# Patient Record
Sex: Female | Born: 1975 | Race: White | Hispanic: No | Marital: Married | Smoking: Current every day smoker
Health system: Southern US, Community
[De-identification: ages and names within clinical notes are randomized; demographics above are authoritative.]

## PROBLEM LIST (undated history)

## (undated) DIAGNOSIS — J45909 Unspecified asthma, uncomplicated: Secondary | ICD-10-CM

## (undated) DIAGNOSIS — D649 Anemia, unspecified: Secondary | ICD-10-CM

## (undated) DIAGNOSIS — M5430 Sciatica, unspecified side: Secondary | ICD-10-CM

## (undated) DIAGNOSIS — T7840XA Allergy, unspecified, initial encounter: Secondary | ICD-10-CM

## (undated) DIAGNOSIS — J449 Chronic obstructive pulmonary disease, unspecified: Secondary | ICD-10-CM

## (undated) DIAGNOSIS — M539 Dorsopathy, unspecified: Secondary | ICD-10-CM

## (undated) DIAGNOSIS — K219 Gastro-esophageal reflux disease without esophagitis: Secondary | ICD-10-CM

## (undated) DIAGNOSIS — G5600 Carpal tunnel syndrome, unspecified upper limb: Secondary | ICD-10-CM

## (undated) DIAGNOSIS — F419 Anxiety disorder, unspecified: Secondary | ICD-10-CM

## (undated) HISTORY — DX: Allergy, unspecified, initial encounter: T78.40XA

## (undated) HISTORY — DX: Gastro-esophageal reflux disease without esophagitis: K21.9

## (undated) HISTORY — DX: Unspecified asthma, uncomplicated: J45.909

## (undated) HISTORY — DX: Anemia, unspecified: D64.9

## (undated) HISTORY — DX: Dorsopathy, unspecified: M53.9

## (undated) HISTORY — DX: Anxiety disorder, unspecified: F41.9

## (undated) HISTORY — DX: Sciatica, unspecified side: M54.30

## (undated) HISTORY — DX: Carpal tunnel syndrome, unspecified upper limb: G56.00

## (undated) HISTORY — PX: OTHER SURGICAL HISTORY: SHX169

---

## 2004-11-29 ENCOUNTER — Ambulatory Visit: Payer: Self-pay | Admitting: Specialist

## 2006-09-09 ENCOUNTER — Emergency Department: Payer: Self-pay | Admitting: General Practice

## 2007-07-01 ENCOUNTER — Emergency Department: Payer: Self-pay | Admitting: Internal Medicine

## 2007-07-08 ENCOUNTER — Emergency Department: Payer: Self-pay | Admitting: Emergency Medicine

## 2008-03-05 ENCOUNTER — Emergency Department: Payer: Self-pay | Admitting: Emergency Medicine

## 2009-02-16 ENCOUNTER — Emergency Department: Payer: Self-pay | Admitting: Emergency Medicine

## 2009-10-27 ENCOUNTER — Emergency Department: Payer: Self-pay | Admitting: Emergency Medicine

## 2010-01-09 ENCOUNTER — Emergency Department: Payer: Self-pay | Admitting: Emergency Medicine

## 2011-06-24 ENCOUNTER — Emergency Department: Payer: Self-pay | Admitting: Emergency Medicine

## 2012-04-19 ENCOUNTER — Emergency Department: Payer: Self-pay | Admitting: Emergency Medicine

## 2017-06-07 ENCOUNTER — Emergency Department: Payer: Self-pay

## 2017-06-07 ENCOUNTER — Other Ambulatory Visit: Payer: Self-pay

## 2017-06-07 ENCOUNTER — Emergency Department
Admission: EM | Admit: 2017-06-07 | Discharge: 2017-06-07 | Disposition: A | Discharge status: Home / Self Care | Payer: Self-pay | Attending: Emergency Medicine | Admitting: Emergency Medicine

## 2017-06-07 DIAGNOSIS — R1011 Right upper quadrant pain: Secondary | ICD-10-CM | POA: Insufficient documentation

## 2017-06-07 DIAGNOSIS — F17210 Nicotine dependence, cigarettes, uncomplicated: Secondary | ICD-10-CM | POA: Insufficient documentation

## 2017-06-07 DIAGNOSIS — N390 Urinary tract infection, site not specified: Secondary | ICD-10-CM | POA: Insufficient documentation

## 2017-06-07 DIAGNOSIS — J449 Chronic obstructive pulmonary disease, unspecified: Secondary | ICD-10-CM | POA: Insufficient documentation

## 2017-06-07 HISTORY — DX: Chronic obstructive pulmonary disease, unspecified: J44.9

## 2017-06-07 LAB — URINALYSIS, COMPLETE (UACMP) WITH MICROSCOPIC
Bacteria, UA: NONE SEEN
SPECIFIC GRAVITY, URINE: 1.028 (ref 1.005–1.030)

## 2017-06-07 LAB — BASIC METABOLIC PANEL
Anion gap: 10 (ref 5–15)
BUN: 14 mg/dL (ref 6–20)
CALCIUM: 8.8 mg/dL — AB (ref 8.9–10.3)
CO2: 24 mmol/L (ref 22–32)
CREATININE: 0.81 mg/dL (ref 0.44–1.00)
Chloride: 105 mmol/L (ref 101–111)
GFR calc non Af Amer: >60 mL/min (ref >60)
Glucose, Bld: 92 mg/dL (ref 65–99)
Potassium: 3.9 mmol/L (ref 3.5–5.1)
SODIUM: 139 mmol/L (ref 135–145)

## 2017-06-07 LAB — CBC
HCT: 40.9 % (ref 35.0–47.0)
Hemoglobin: 13.6 g/dL (ref 12.0–16.0)
MCH: 31.1 pg (ref 26.0–34.0)
MCHC: 33.3 g/dL (ref 32.0–36.0)
MCV: 93.3 fL (ref 80.0–100.0)
PLATELETS: 312 10*3/uL (ref 150–440)
RBC: 4.38 MIL/uL (ref 3.80–5.20)
RDW: 14.5 % (ref 11.5–14.5)
WBC: 11.4 10*3/uL — ABNORMAL HIGH (ref 3.6–11.0)

## 2017-06-07 LAB — HEPATIC FUNCTION PANEL
ALBUMIN: 3.3 g/dL — AB (ref 3.5–5.0)
ALK PHOS: 63 U/L (ref 38–126)
ALT: 42 U/L (ref 14–54)
AST: 62 U/L — AB (ref 15–41)
Bilirubin, Direct: <0.1 mg/dL — ABNORMAL LOW (ref 0.1–0.5)
Total Bilirubin: 0.6 mg/dL (ref 0.3–1.2)
Total Protein: 6.5 g/dL (ref 6.5–8.1)

## 2017-06-07 LAB — TROPONIN I

## 2017-06-07 LAB — LIPASE, BLOOD: Lipase: 31 U/L (ref 11–51)

## 2017-06-07 MED ORDER — OMEPRAZOLE 40 MG PO CPDR: 40.0000 mg | DELAYED_RELEASE_CAPSULE | Freq: Every day | ORAL | 1 refills | Status: DC

## 2017-06-07 MED ORDER — CEPHALEXIN 500 MG PO CAPS: 500.0000 mg | ORAL_CAPSULE | Freq: Two times a day (BID) | ORAL | 0 refills | Status: AC

## 2017-06-07 MED ORDER — OXYCODONE-ACETAMINOPHEN 5-325 MG PO TABS
1.0000 | ORAL_TABLET | Freq: Once | ORAL | Status: AC
  Administered 2017-06-07: 1 via ORAL
  Filled 2017-06-07: qty 1

## 2017-06-07 MED ORDER — ALBUTEROL SULFATE HFA 108 (90 BASE) MCG/ACT IN AERS: 2.0000 | INHALATION_SPRAY | Freq: Four times a day (QID) | RESPIRATORY_TRACT | 2 refills | Status: DC | PRN

## 2017-06-07 MED ORDER — CEPHALEXIN 500 MG PO CAPS
500.0000 mg | ORAL_CAPSULE | Freq: Once | ORAL | Status: AC
  Administered 2017-06-07: 500 mg via ORAL
  Filled 2017-06-07: qty 1

## 2017-06-07 NOTE — ED Triage Notes (Signed)
Pt came to ED via pov c/o upper abdominal pain starting yesterday w/ sob. Pt also c/o burning with urination and right ear pain.

## 2017-06-07 NOTE — ED Notes (Signed)
Patient transported to US 

## 2017-06-07 NOTE — ED Notes (Signed)
Pt ambulatory upon discharge. Pt and significant other verbalized understanding of discharge instructions, prescriptions and follow-up care. VSS. Skin warm and dry. A&O x4. 

## 2017-06-07 NOTE — ED Provider Notes (Signed)
[facility] Emergency Department Provider Note ____________________________________________   First MD Initiated Contact with Patient 06/07/17 1436     (approximate)  I have reviewed the triage vital signs and the nursing notes.   HISTORY  Chief Complaint Abdominal Pain    HPI Amber Edwards is a 42 y.o. female with history of COPD who presents with right upper quadrant abdominal pain over the last 2-3 days, constant but intermittent in intensity, and associated with mildly increased shortness of breath.  Patient reports some nausea, but no vomiting or diarrhea.  She denies any relation to food.  She reports no prior history of this pain for the last several days, but she states her shortness of breath is chronic.  The patient also reports dysuria and urinary frequency over the last 3 weeks which she attempted to self medicate with cranberry juice.  She reports mild bilateral low back pain but no flank pain, and no fever or chills.  Past Medical History:  Diagnosis Date  . COPD (chronic obstructive pulmonary disease) (HCC)     There are no active problems to display for this patient.   Past Surgical History:  Procedure Laterality Date  . left knee surgery      Prior to Admission medications   Not on File    Allergies Patient has no known allergies.  No family history on file.  Social History Social History   Tobacco Use  . Smoking status: Current Every Day Smoker    Packs/day: 1.00    Types: Cigarettes  . Smokeless tobacco: Never Used  Substance Use Topics  . Alcohol use: No    Frequency: Never  . Drug use: No    Review of Systems  Constitutional: No fever/chills.  Eyes: No redness. ENT: No sore throat. Cardiovascular: Denies chest pain. Respiratory: Positive for shortness of breath. Gastrointestinal: Positive for nausea, no vomiting. Genitourinary: Positive for dysuria.  Musculoskeletal: Positive for back pain. Skin:  Negative for rash. Neurological: Negative for headache.   ____________________________________________   PHYSICAL EXAM:  VITAL SIGNS: ED Triage Vitals  Enc Vitals Group     BP 06/07/17 1126 137/69     Pulse Rate 06/07/17 1126 81     Resp 06/07/17 1126 18     Temp 06/07/17 1126 97.7 F (36.5 C)     Temp Source 06/07/17 1126 Oral     SpO2 06/07/17 1126 97 %     Weight 06/07/17 1127 260 lb (117.9 kg)     Height 06/07/17 1127 _0  (1.626 m)     Head Circumference --      Peak Flow --      Pain Score 06/07/17 1415 8     Pain Loc --      Pain Edu? --      Excl. in [place]? --     Constitutional: Alert and oriented. Well appearing and in no acute distress. Eyes: Conjunctivae are normal.  No scleral icterus. Head: Atraumatic. Nose: No congestion/rhinnorhea. Mouth/Throat: Mucous membranes are moist.   Neck: Normal range of motion.  Cardiovascular: Normal rate, regular rhythm. Grossly normal heart sounds.  Good peripheral circulation. Respiratory: Normal respiratory effort.  No retractions.  Slightly decreased breath sounds bilaterally but lungs otherwise CTAB. Gastrointestinal: Soft with moderate right upper quadrant tenderness. No distention.  Genitourinary: No CVA tenderness. Musculoskeletal: No lower extremity edema.  Extremities warm and well perfused.  Neurologic:  Normal speech and language. No gross focal neurologic deficits are appreciated.  Skin:  Skin is warm and dry. No rash noted. Psychiatric: Mood and affect are normal. Speech and behavior are normal.  ____________________________________________   LABS (all labs ordered are listed, but only abnormal results are displayed)  Labs Reviewed  BASIC METABOLIC PANEL - Abnormal; Notable for the following components:      Result Value   Calcium 8.8 (*)    All other components within normal limits  CBC - Abnormal; Notable for the following components:   WBC 11.4 (*)    All other components within normal limits    URINALYSIS, COMPLETE (UACMP) WITH MICROSCOPIC - Abnormal; Notable for the following components:   Color, Urine ORANGE (*)    APPearance CLOUDY (*)    Glucose, UA   (*)    Value: TEST NOT REPORTED DUE TO COLOR INTERFERENCE OF URINE PIGMENT   Hgb urine dipstick   (*)    Value: TEST NOT REPORTED DUE TO COLOR INTERFERENCE OF URINE PIGMENT   Bilirubin Urine   (*)    Value: TEST NOT REPORTED DUE TO COLOR INTERFERENCE OF URINE PIGMENT   Ketones, ur   (*)    Value: TEST NOT REPORTED DUE TO COLOR INTERFERENCE OF URINE PIGMENT   Protein, ur   (*)    Value: TEST NOT REPORTED DUE TO COLOR INTERFERENCE OF URINE PIGMENT   Nitrite   (*)    Value: TEST NOT REPORTED DUE TO COLOR INTERFERENCE OF URINE PIGMENT   Leukocytes, UA   (*)    Value: TEST NOT REPORTED DUE TO COLOR INTERFERENCE OF URINE PIGMENT   Squamous Epithelial / LPF 0-5 (*)    All other components within normal limits  HEPATIC FUNCTION PANEL - Abnormal; Notable for the following components:   Albumin 3.3 (*)    AST 62 (*)    Bilirubin, Direct <0.1 (*)    All other components within normal limits  TROPONIN I  LIPASE, BLOOD  POC URINE PREG, ED   ____________________________________________  EKG  ED ECG REPORT I, Arta Silence, the attending physician, personally viewed and interpreted this ECG.  Date: 06/07/2017 EKG Time: 1131 Rate: 81 Rhythm: normal sinus rhythm QRS Axis: normal Intervals: normal ST/T Wave abnormalities: normal Narrative Interpretation: no evidence of acute ischemia  ____________________________________________  RADIOLOGY  CXR: No focal infiltrate or other acute findings [place] RUQ: CBD borderline dilated, otherwise no acute findings  ____________________________________________   PROCEDURES  Procedure(s) performed: No    Critical Care performed: No ____________________________________________   INITIAL IMPRESSION / ASSESSMENT AND PLAN / ED COURSE  Pertinent labs & imaging results that  were available during my care of the patient were reviewed by me and considered in my medical decision making (see chart for details).  42 year old female with history of COPD and other PMH as noted above presents with right upper quadrant pain over the last 2-3 days associated with some nausea, in which she states somewhat exacerbates her chronic shortness of breath.  She also reports dysuria and frequency over the last 3 weeks.  Past medical records reviewed in epic and are noncontributory.  On exam, the patient does have some tenderness the right upper quadrant but the remainder the abdomen is soft.  Her lungs are clear.  There are no other significant exam findings.  Differential for the right upper quadrant pain includes primarily biliary colic, less likely acute cholecystitis, or gastritis, pancreatitis.  Plan for right upper quadrant ultrasound, chest x-ray, and basic and hepatobiliary labs.  Urinary symptoms are consistent with an acute UTI,  and the patient's UA corresponds to this.    ----------------------------------------- 5:13 PM on 06/07/2017 -----------------------------------------  Ultrasound does not reveal gallstones.  CBD is borderline large, but since there is no elevation in bilirubins or alk phos and LFTs are normal, no evidence for biliary obstruction.  Suspect therefore likely gastritis is most likely etiology.  In addition, patient has UTI as noted above.  We will discharge with antibiotics as well as a PPI and give the patient an outpatient primary care referral.  Discharge instructions and return precautions explained to the patient, and she expresses understanding.  ____________________________________________   FINAL CLINICAL IMPRESSION(S) / ED DIAGNOSES  Final diagnoses:  Right upper quadrant abdominal pain  Urinary tract infection without hematuria, site unspecified      NEW MEDICATIONS STARTED DURING THIS VISIT:  New Prescriptions   No medications  on file     Note:  This document was prepared using Dragon voice recognition software and may include unintentional dictation errors.    Arta Silence, MD 06/07/17 1715

## 2017-06-07 NOTE — Discharge Instructions (Signed)
Take the antibiotic as prescribed and finish the full course.  You should also start on the acid reducer to help improve your upper abdominal pain.  Return to the emergency department for new, worsening, or persistent abdominal pain, vomiting, fevers, weakness, or any other new or worsening symptoms that concern you.

## 2017-06-27 ENCOUNTER — Other Ambulatory Visit: Payer: Self-pay

## 2017-06-27 ENCOUNTER — Emergency Department
Admission: EM | Admit: 2017-06-27 | Discharge: 2017-06-27 | Disposition: A | Discharge status: Home / Self Care | Payer: Self-pay | Attending: Emergency Medicine | Admitting: Emergency Medicine

## 2017-06-27 ENCOUNTER — Emergency Department: Payer: Self-pay

## 2017-06-27 ENCOUNTER — Encounter: Payer: Self-pay | Admitting: Emergency Medicine

## 2017-06-27 DIAGNOSIS — J40 Bronchitis, not specified as acute or chronic: Secondary | ICD-10-CM | POA: Insufficient documentation

## 2017-06-27 DIAGNOSIS — R509 Fever, unspecified: Secondary | ICD-10-CM | POA: Insufficient documentation

## 2017-06-27 DIAGNOSIS — J449 Chronic obstructive pulmonary disease, unspecified: Secondary | ICD-10-CM | POA: Insufficient documentation

## 2017-06-27 DIAGNOSIS — F17210 Nicotine dependence, cigarettes, uncomplicated: Secondary | ICD-10-CM | POA: Insufficient documentation

## 2017-06-27 DIAGNOSIS — R05 Cough: Secondary | ICD-10-CM | POA: Insufficient documentation

## 2017-06-27 DIAGNOSIS — J189 Pneumonia, unspecified organism: Secondary | ICD-10-CM | POA: Insufficient documentation

## 2017-06-27 DIAGNOSIS — Z79899 Other long term (current) drug therapy: Secondary | ICD-10-CM | POA: Insufficient documentation

## 2017-06-27 DIAGNOSIS — R0981 Nasal congestion: Secondary | ICD-10-CM | POA: Insufficient documentation

## 2017-06-27 DIAGNOSIS — R0602 Shortness of breath: Secondary | ICD-10-CM | POA: Insufficient documentation

## 2017-06-27 LAB — BASIC METABOLIC PANEL
ANION GAP: 11 (ref 5–15)
BUN: 9 mg/dL (ref 6–20)
CO2: 23 mmol/L (ref 22–32)
Calcium: 9 mg/dL (ref 8.9–10.3)
Chloride: 103 mmol/L (ref 101–111)
Creatinine, Ser: 0.72 mg/dL (ref 0.44–1.00)
GFR calc Af Amer: >60 mL/min (ref >60)
Glucose, Bld: 87 mg/dL (ref 65–99)
POTASSIUM: 3.8 mmol/L (ref 3.5–5.1)
SODIUM: 137 mmol/L (ref 135–145)

## 2017-06-27 LAB — CBC
HEMATOCRIT: 44.7 % (ref 35.0–47.0)
HEMOGLOBIN: 15.3 g/dL (ref 12.0–16.0)
MCH: 31.4 pg (ref 26.0–34.0)
MCHC: 34.2 g/dL (ref 32.0–36.0)
MCV: 91.8 fL (ref 80.0–100.0)
Platelets: 262 10*3/uL (ref 150–440)
RBC: 4.87 MIL/uL (ref 3.80–5.20)
RDW: 14.5 % (ref 11.5–14.5)
WBC: 5.2 10*3/uL (ref 3.6–11.0)

## 2017-06-27 LAB — TROPONIN I

## 2017-06-27 MED ORDER — GUAIFENESIN-CODEINE 100-10 MG/5ML PO SOLN: 5.0000 mL | Freq: Four times a day (QID) | ORAL | 0 refills | Status: DC | PRN

## 2017-06-27 MED ORDER — GUAIFENESIN 100 MG/5ML PO SOLN
400.0000 mg | ORAL | Status: DC | PRN
  Administered 2017-06-27: 400 mg via ORAL
  Filled 2017-06-27: qty 20

## 2017-06-27 MED ORDER — HYDROCOD POLST-CPM POLST ER 10-8 MG/5ML PO SUER
5.0000 mL | Freq: Once | ORAL | Status: AC
  Administered 2017-06-27: 5 mL via ORAL
  Filled 2017-06-27: qty 5

## 2017-06-27 MED ORDER — ALBUTEROL SULFATE (2.5 MG/3ML) 0.083% IN NEBU
5.0000 mg | INHALATION_SOLUTION | Freq: Once | RESPIRATORY_TRACT | Status: AC
  Administered 2017-06-27: 5 mg via RESPIRATORY_TRACT
  Filled 2017-06-27: qty 6

## 2017-06-27 MED ORDER — AZITHROMYCIN 500 MG PO TABS
500.0000 mg | ORAL_TABLET | Freq: Once | ORAL | Status: AC
  Administered 2017-06-27: 500 mg via ORAL
  Filled 2017-06-27: qty 1

## 2017-06-27 MED ORDER — AZITHROMYCIN 250 MG PO TABS: 250.0000 mg | ORAL_TABLET | Freq: Every day | ORAL | 0 refills | Status: DC

## 2017-06-27 NOTE — ED Provider Notes (Signed)
[facility] Emergency Department Provider Note  Time seen: 10:21 PM  I have reviewed the triage vital signs and the nursing notes.   HISTORY  Chief Complaint Shortness of Breath    HPI Amber Edwards is a 42 y.o. female with a past medical history of COPD who presents to the emergency department for cough congestion low-grade fever.  According to the patient over the past 1 week she has had a low-grade fever cough congestion difficulty catching her breath.  Patient has been using her albuterol inhaler with minimal relief.  Does not have a primary care doctor to follow-up with so she came to the emergency department for evaluation.  Patient states some pain especially in her back worse with coughing.  Denies sputum production.  Denies any pleuritic nature to the pain.  Denies any leg pain or swelling.  Positive for nasal congestion.  Past Medical History:  Diagnosis Date  . COPD (chronic obstructive pulmonary disease) (HCC)     There are no active problems to display for this patient.   Past Surgical History:  Procedure Laterality Date  . left knee surgery      Prior to Admission medications   Medication Sig Start Date End Date Taking? Authorizing Provider  albuterol (PROVENTIL HFA;VENTOLIN HFA) 108 (90 Base) MCG/ACT inhaler Inhale 2 puffs into the lungs every 6 (six) hours as needed for wheezing or shortness of breath. 06/07/17   Dionne BucySiadecki, Sebastian, MD  omeprazole (PRILOSEC) 40 MG capsule Take 1 capsule (40 mg total) by mouth daily. 06/07/17 08/06/17  Dionne BucySiadecki, Sebastian, MD    No Known Allergies  No family history on file.  Social History Social History   Tobacco Use  . Smoking status: Current Every Day Smoker    Packs/day: 1.00    Types: Cigarettes  . Smokeless tobacco: Never Used  Substance Use Topics  . Alcohol use: No    Frequency: Never  . Drug use: No    Review of Systems Constitutional: Low-grade fever Eyes: Negative for visual  complaints ENT: Positive for congestion Cardiovascular: Negative for chest pain. Respiratory: Positive for shortness of breath.  Positive for cough. Gastrointestinal: Negative for abdominal pain, vomiting Genitourinary: Negative for urinary compaints Musculoskeletal: Negative for leg pain or swelling Skin: Negative for skin complaints  Neurological: Negative for headache All other ROS negative  ____________________________________________   PHYSICAL EXAM:  VITAL SIGNS: ED Triage Vitals  Enc Vitals Group     BP 06/27/17 2028 (!) 136/92     Pulse Rate 06/27/17 2028 (!) 105     Resp 06/27/17 2028 (!) 24     Temp 06/27/17 2028 99.6 F (37.6 C)     Temp Source 06/27/17 2028 Oral     SpO2 06/27/17 2028 97 %     Weight 06/27/17 2029 260 lb (117.9 kg)     Height 06/27/17 2029 5\' 4"  (1.626 m)     Head Circumference --      Peak Flow --      Pain Score 06/27/17 2028 9     Pain Loc --      Pain Edu? --      Excl. in [place]? --     Constitutional: Alert and oriented. Well appearing and in no distress. Eyes: Normal exam ENT   Head: Normocephalic and atraumatic.   Nose: Moderate congestion and rhinorrhea   Mouth/Throat: Mucous membranes are moist. Cardiovascular: Normal rate, regular rhythm around 100 bpm.  No obvious murmur. Respiratory: Very mild tachypnea, slight  expiratory wheeze bilaterally.  No rales or rhonchi. Gastrointestinal: Soft and nontender. No distention Musculoskeletal: Nontender with normal range of motion in all extremities.  Neurologic:  Normal speech and language. No gross focal neurologic deficits  Psychiatric: Mood and affect are normal.   ____________________________________________    EKG  EKG reviewed and interpreted by myself shows sinus tachycardia 101 bpm with a narrow QRS, normal axis, normal intervals, nonspecific ST changes.  No ST elevation.  ____________________________________________    RADIOLOGY  Chest x-ray shows likely  bronchitis with possible pneumonitis.  ____________________________________________   INITIAL IMPRESSION / ASSESSMENT AND PLAN / ED COURSE  Pertinent labs & imaging results that were available during my care of the patient were reviewed by me and considered in my medical decision making (see chart for details).  Patient presents emergency department for 1 week of cough, congestion, low-grade fever.  Overall patient appears well, no distress, slight expiratory wheeze on exam, history of COPD.  Vitals show a low-grade temperature 99.6.  Initially with a 93% room air saturation received breathing treatments in the emergency department currently with a 98% room air saturation throughout my examination.  Heart rate around 90-100 bpm.  Patient's x-ray most consistent with pneumonitis and bronchitis.  We will begin antibiotics.  Patient's labs are largely within normal limits including normal white blood cell count.  Negative troponin.  I discussed continued care at home with albuterol inhaler, finishing her course of antibiotics and I will prescribe a cough medication for the patient.  I also discussed the importance of following up with her primary care doctor in 3-4 weeks to ensure resolution.  Patient agreeable with this plan of care.  I discussed chest pain return precautions.  ____________________________________________   FINAL CLINICAL IMPRESSION(S) / ED DIAGNOSES  Upper respiratory infection Pneumonitis Bronchitis    Minna Antis, MD 06/27/17 2227

## 2017-06-27 NOTE — Discharge Instructions (Signed)
Please follow-up with a primary care doctor for recheck/reevaluation.  Return to the emergency department for development of chest pain, worsening trouble breathing, or any other symptom personally concerning to yourself.

## 2017-06-27 NOTE — ED Notes (Signed)
Patient is resting comfortably. 

## 2017-06-27 NOTE — ED Notes (Signed)
Pt to the ER for sinus congestion and dry hacking cough. Pt taking nyquil, dayquil , vitamin C and tylenol and muccinex at home. No relief with anything. Pt reports chills. Pt is breathing heavily. Pt is not on breathing treatments at home all the time. Pt did one at home but it was her last one. Pt has a hx of copd but no PCP. Pt says she is supposed to be on breathing tx but she stopped due to finances and [facility] didn't have an opening till March.

## 2017-06-27 NOTE — ED Triage Notes (Signed)
Pt reports cough, shortness of breath history of COPD reports back pain sharp pain. Reports used inhaler prior to arrival no relief.

## 2020-03-13 ENCOUNTER — Ambulatory Visit (INDEPENDENT_AMBULATORY_CARE_PROVIDER_SITE_OTHER): Payer: Self-pay

## 2020-03-13 ENCOUNTER — Ambulatory Visit
Admission: EM | Admit: 2020-03-13 | Discharge: 2020-03-13 | Disposition: A | Discharge status: Home / Self Care | Payer: Self-pay | Attending: Family Medicine | Admitting: Family Medicine

## 2020-03-13 ENCOUNTER — Other Ambulatory Visit: Payer: Self-pay

## 2020-03-13 DIAGNOSIS — M25511 Pain in right shoulder: Secondary | ICD-10-CM

## 2020-03-13 DIAGNOSIS — M25411 Effusion, right shoulder: Secondary | ICD-10-CM

## 2020-03-13 MED ORDER — HYDROCODONE-ACETAMINOPHEN 5-325 MG PO TABS: 2.0000 | ORAL_TABLET | ORAL | 0 refills | Status: DC | PRN

## 2020-03-13 MED ORDER — CYCLOBENZAPRINE HCL 10 MG PO TABS: 10.0000 mg | ORAL_TABLET | Freq: Two times a day (BID) | ORAL | 0 refills | Status: DC | PRN

## 2020-03-13 NOTE — ED Provider Notes (Addendum)
[facility]   [id number] 03/13/20 Arrival Time: 1149  ZD:GUYQI PAIN  SUBJECTIVE: History from: patient. Amber Edwards is a 44 y.o. female complains of right shoulder pain that began last night after a fall and catching herself with her right arm. Denies a precipitating event or specific injury. Localizes the pain to the anterior aspect of the shoulder. Describes the pain as constant and sharp in character. Has tried OTC medications without relief. Reports severely limited mobility with the R shoulder. Symptoms are made worse with activity.  Denies similar symptoms in the past. Denies fever, chills, erythema, ecchymosis, effusion, weakness, numbness and tingling, saddle paresthesias, loss of bowel or bladder function.      ROS: As per HPI.  All other pertinent ROS negative.     Past Medical History:  Diagnosis Date  . COPD (chronic obstructive pulmonary disease) (HCC)    Past Surgical History:  Procedure Laterality Date  . left knee surgery     No Known Allergies No current facility-administered medications on file prior to encounter.   Current Outpatient Medications on File Prior to Encounter  Medication Sig Dispense Refill  . albuterol (PROVENTIL HFA;VENTOLIN HFA) 108 (90 Base) MCG/ACT inhaler Inhale 2 puffs into the lungs every 6 (six) hours as needed for wheezing or shortness of breath. 1 Inhaler 2  . azithromycin (ZITHROMAX) 250 MG tablet Take 1 tablet (250 mg total) by mouth daily. 4 each 0  . guaiFENesin-codeine 100-10 MG/5ML syrup Take 5 mLs by mouth every 6 (six) hours as needed for cough. 120 mL 0  . omeprazole (PRILOSEC) 40 MG capsule Take 1 capsule (40 mg total) by mouth daily. 30 capsule 1   Social History   Socioeconomic History  . Marital status: Married    Spouse name: Not on file  . Number of children: Not on file  . Years of education: Not on file  . Highest education level: Not on file  Occupational History  . Not on file  Tobacco Use  .  Smoking status: Current Every Day Smoker    Packs/day: 1.00    Types: Cigarettes  . Smokeless tobacco: Never Used  Substance and Sexual Activity  . Alcohol use: No  . Drug use: No  . Sexual activity: Not on file  Other Topics Concern  . Not on file  Social History Narrative  . Not on file   Social Determinants of Health   Financial Resource Strain:   . Difficulty of Paying Living Expenses: Not on file  Food Insecurity:   . Worried About Programme researcher, broadcasting/film/video in the Last Year: Not on file  . Ran Out of Food in the Last Year: Not on file  Transportation Needs:   . Lack of Transportation (Medical): Not on file  . Lack of Transportation (Non-Medical): Not on file  Physical Activity:   . Days of Exercise per Week: Not on file  . Minutes of Exercise per Session: Not on file  Stress:   . Feeling of Stress : Not on file  Social Connections:   . Frequency of Communication with Friends and Family: Not on file  . Frequency of Social Gatherings with Friends and Family: Not on file  . Attends Religious Services: Not on file  . Active Member of Clubs or Organizations: Not on file  . Attends Banker Meetings: Not on file  . Marital Status: Not on file  Intimate Partner Violence:   . Fear of Current or Ex-Partner: Not on file  .  Emotionally Abused: Not on file  . Physically Abused: Not on file  . Sexually Abused: Not on file   No family history on file.  OBJECTIVE:  Vitals:   03/13/20 1159  BP: 135/89  Pulse: 89  Resp: 18  Temp: 98.2 F (36.8 C)  TempSrc: Oral  SpO2: 96%    General appearance: ALERT; in no acute distress.  Head: NCAT Lungs: Normal respiratory effort [id number] 2+ bilaterally. Cap refill < 2 seconds Musculoskeletal:  Inspection: Skin warm, dry, clear and intact. Swelling noted to anterior R shoulder Palpation: Anterior R shoulder very tender to palpation ROM: limited ROM active and passive to R shoulder Neurologic: Ambulates without difficulty;  Sensation intact about the upper/ lower extremities Psychological: alert and cooperative; normal mood and affect  DIAGNOSTIC STUDIES:  DG Shoulder Right  Result Date: 03/13/2020 CLINICAL DATA:  Right shoulder pain for 1 day post fall and landing on shoulder, constant pain with limited range of motion, unable to lift arm and extend arm out from side EXAM: RIGHT SHOULDER - 2+ VIEW COMPARISON:  None FINDINGS: Osseous mineralization normal. AC joint alignment diameter upper normal. Visualized RIGHT ribs intact. No fracture, dislocation, or bone destruction. IMPRESSION: No acute osseous abnormalities. Electronically Signed   By: Ulyses Southward M.D.   On: 03/13/2020 13:01     ASSESSMENT & PLAN:  1. Pain and swelling of right shoulder     Meds ordered this encounter  Medications  . cyclobenzaprine (FLEXERIL) 10 MG tablet    Sig: Take 1 tablet (10 mg total) by mouth 2 (two) times daily as needed for muscle spasms.    Dispense:  20 tablet    Refill:  0    Order Specific Question:   Supervising Provider    Answer:   Merrilee Jansky [id number]  . HYDROcodone-acetaminophen (NORCO/VICODIN) 5-325 MG tablet    Sig: Take 2 tablets by mouth every 4 (four) hours as needed.    Dispense:  10 tablet    Refill:  0    Order Specific Question:   Supervising Provider    Answer:   Merrilee Jansky [[id number]]   Xray negative today Highly suspicious for soft tissue injury May need surgical repair Continue conservative management of rest, ice, and gentle stretches Take ibuprofen as needed for pain relief (may cause abdominal discomfort, ulcers, and GI bleeds avoid taking with other NSAIDs) Take Norco as prescribed Take cyclobenzaprine at nighttime for symptomatic relief. Avoid driving or operating heavy machinery while using medication. Follow up with ortho if symptoms persist Return or go to the ER if you have any new or worsening symptoms (fever, chills, chest pain, abdominal pain, changes in bowel or  bladder habits, pain radiating into lower legs)   [place] Controlled Substances Registry consulted for this patient. I feel the risk/benefit ratio today is favorable for proceeding with this prescription for a controlled substance. Medication sedation precautions given.  Reviewed expectations re: course of current medical issues. Questions answered. Outlined signs and symptoms indicating need for more acute intervention. Patient verbalized understanding. After Visit Summary given.       Moshe Cipro, NP 03/13/20 1309    Moshe Cipro, NP 03/13/20 1310

## 2020-03-13 NOTE — Discharge Instructions (Signed)
Xray is negative today. I suspect a rotator cuff injury.  We have placed your arm in a sling. Wear this for comfort.  I have sent in Norco for you to take as needed every 6 hours for pain  I have sent in flexeril for you to take as needed for muscle spasms  Follow up with orthopedics

## 2020-03-13 NOTE — ED Triage Notes (Signed)
Patient reports falling last night and landing on right shoulder, reports limited ROM at shoulder.

## 2022-02-07 IMAGING — DX DG SHOULDER 2+V*R*
3 series · 3 of 3 positions shown · non-contrast
Comparison: None

CLINICAL DATA: Right shoulder pain for 1 day post fall and landing
on shoulder, constant pain with limited range of motion, unable to
lift arm and extend arm out from side

EXAM:
RIGHT SHOULDER - 2+ VIEW

[shoulder internal rotation ap]
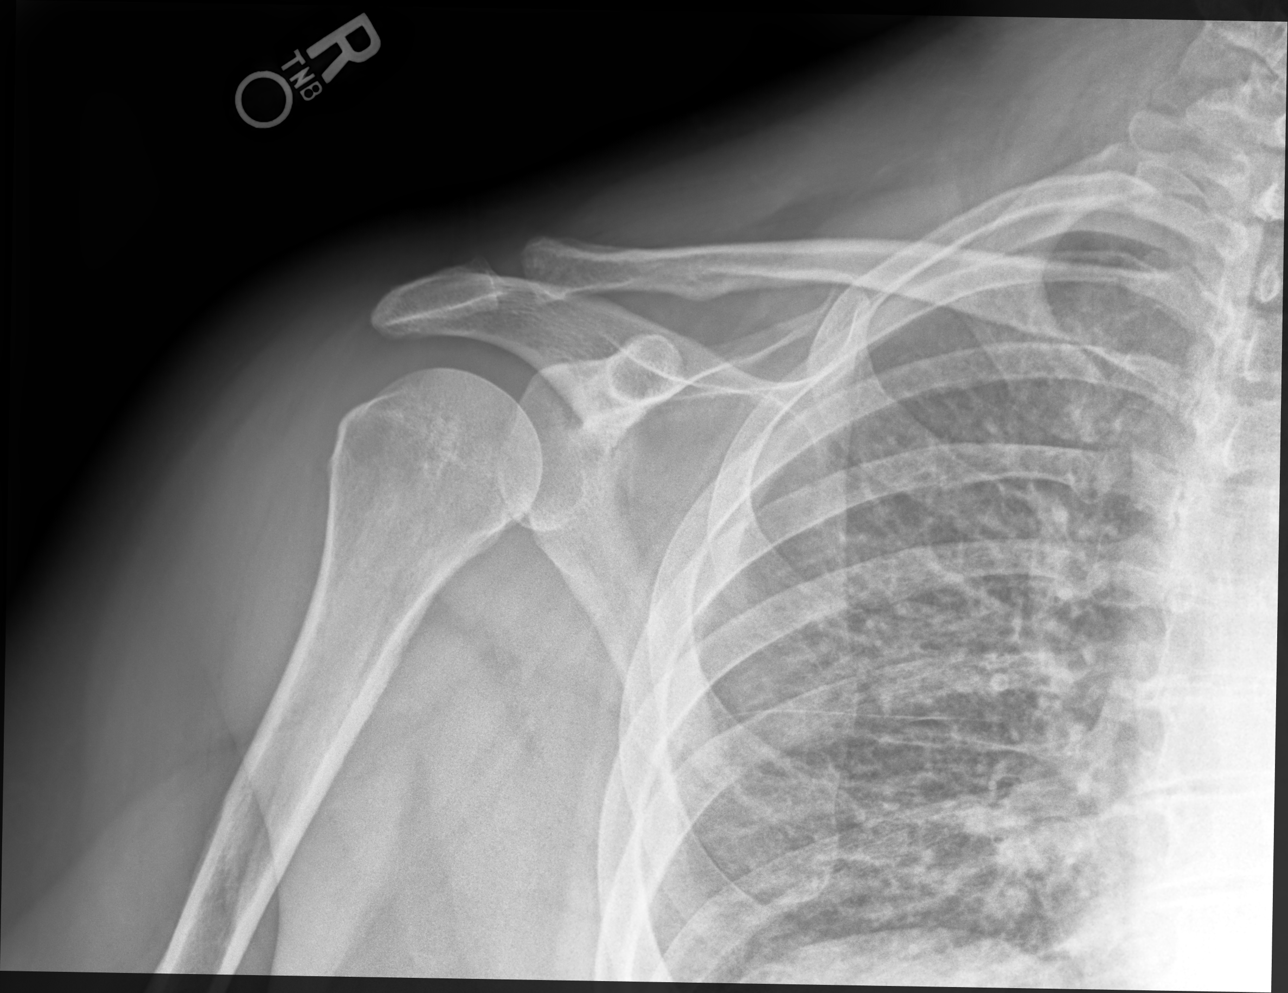

[shoulder (grashey view) ap]
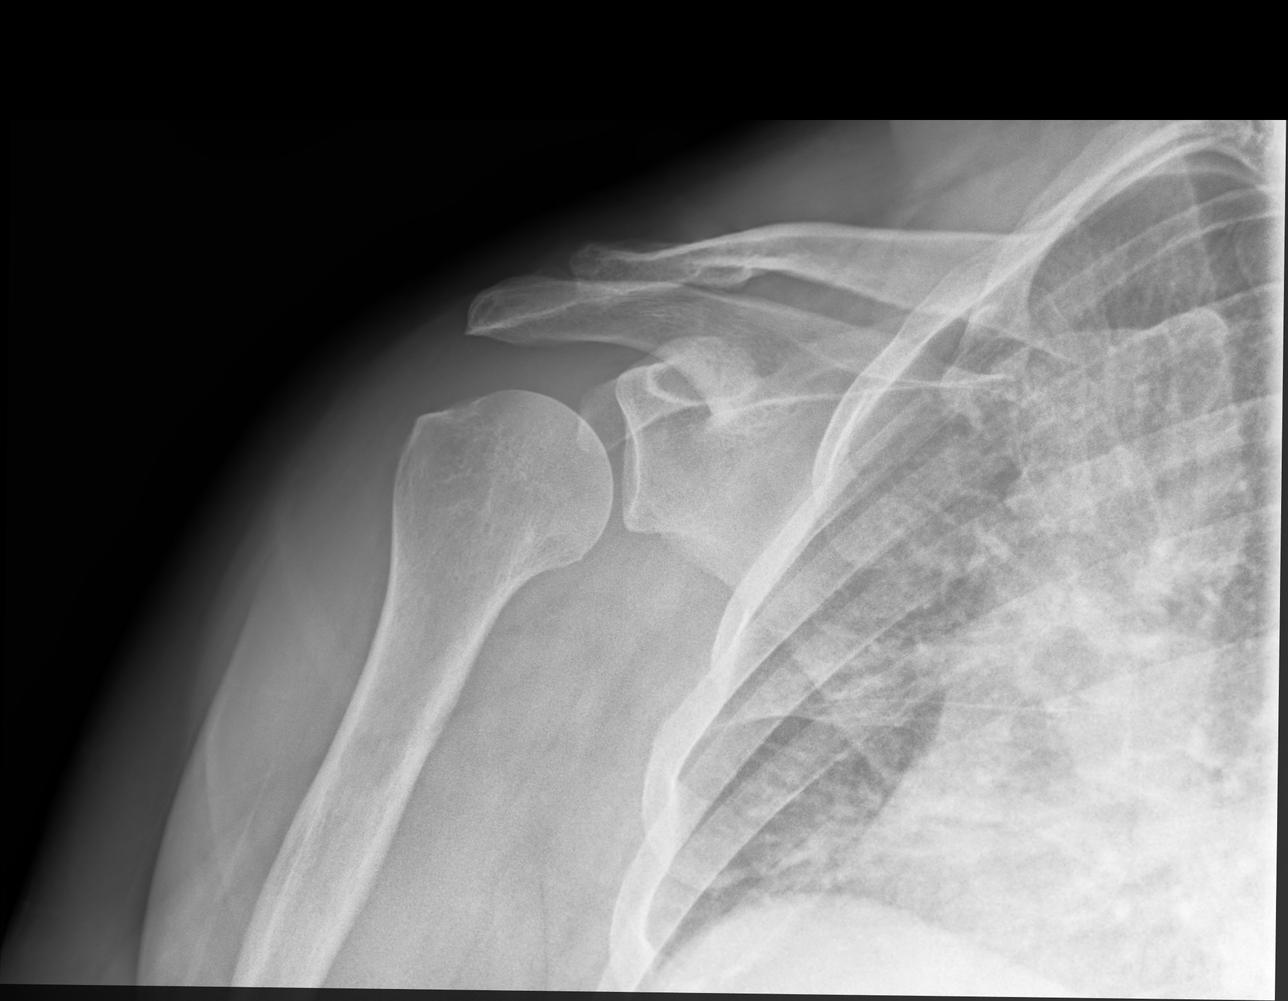

[shoulder (y view) lat]
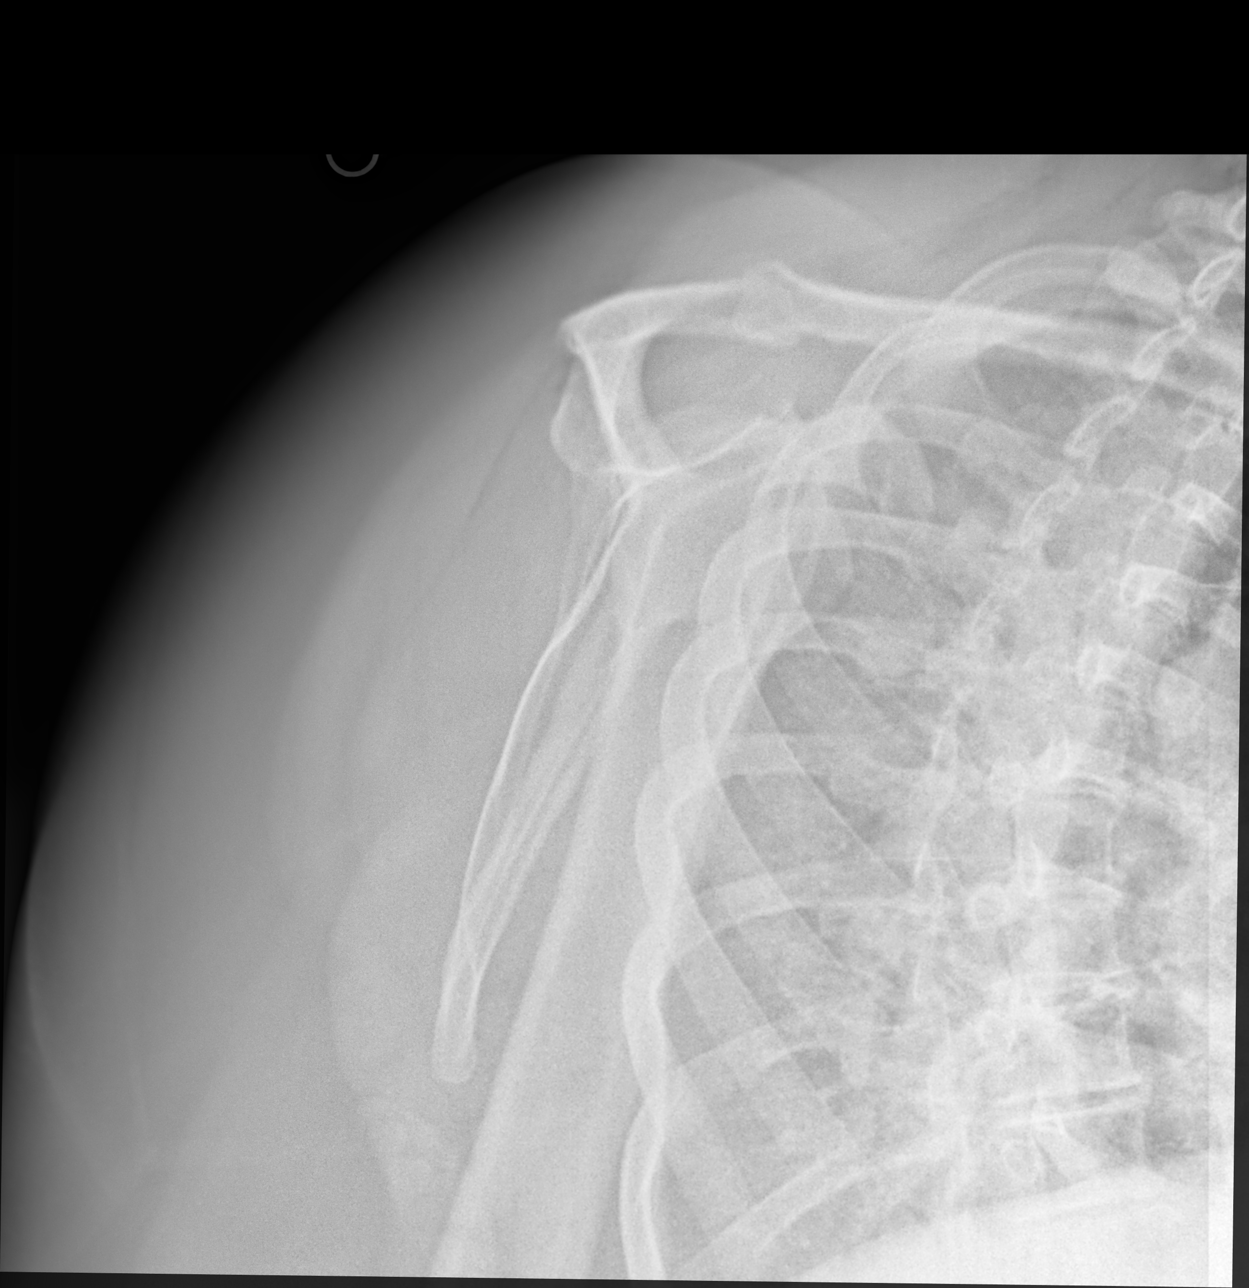

[3 of 3 positions shown; findings below may reference images not displayed]

FINDINGS: Osseous mineralization normal.

AC joint alignment diameter upper normal.

Visualized RIGHT ribs intact.

No fracture, dislocation, or bone destruction.
IMPRESSION: No acute osseous abnormalities.

## 2022-03-22 ENCOUNTER — Other Ambulatory Visit: Payer: Self-pay | Admitting: Physician Assistant

## 2022-03-22 ENCOUNTER — Telehealth: Payer: Self-pay

## 2022-03-22 DIAGNOSIS — Z1231 Encounter for screening mammogram for malignant neoplasm of breast: Secondary | ICD-10-CM

## 2022-03-22 NOTE — Telephone Encounter (Signed)
Gastroenterology Pre-Procedure Review  Request Date: Patient states she can not have the colonoscopy at this time because she states she just does not have time to do it.   PATIENT REVIEW QUESTIONS: The patient responded to the following health history questions as indicated:    1. Are you having any GI issues? no 2. Do you have a personal history of Polyps? no 3. Do you have a family history of Colon Cancer or Polyps? no 4. Diabetes Mellitus? no 5. Joint replacements in the past 12 months?no 6. Major health problems in the past 3 months?Has COPD  7. Any artificial heart valves, MVP, or defibrillator?no    MEDICATIONS & ALLERGIES:    Patient reports the following regarding taking any anticoagulation/antiplatelet therapy:   Plavix, Coumadin, Eliquis, Xarelto, Lovenox, Pradaxa, Brilinta, or Effient? no Aspirin? no  Patient confirms/reports the following medications:  Current Outpatient Medications  Medication Sig Dispense Refill   albuterol (PROVENTIL HFA;VENTOLIN HFA) 108 (90 Base) MCG/ACT inhaler Inhale 2 puffs into the lungs every 6 (six) hours as needed for wheezing or shortness of breath. 1 Inhaler 2   azithromycin (ZITHROMAX) 250 MG tablet Take 1 tablet (250 mg total) by mouth daily. 4 each 0   cyclobenzaprine (FLEXERIL) 10 MG tablet Take 1 tablet (10 mg total) by mouth 2 (two) times daily as needed for muscle spasms. 20 tablet 0   guaiFENesin-codeine 100-10 MG/5ML syrup Take 5 mLs by mouth every 6 (six) hours as needed for cough. 120 mL 0   HYDROcodone-acetaminophen (NORCO/VICODIN) 5-325 MG tablet Take 2 tablets by mouth every 4 (four) hours as needed. 10 tablet 0   omeprazole (PRILOSEC) 40 MG capsule Take 1 capsule (40 mg total) by mouth daily. 30 capsule 1   No current facility-administered medications for this visit.    Patient confirms/reports the following allergies:  No Known Allergies  No orders of the defined types were placed in this encounter.   AUTHORIZATION  INFORMATION Primary Insurance: 1D#: Group #:  Secondary Insurance: 1D#: Group #:  SCHEDULE INFORMATION: Date:  Time: Location:

## 2022-10-17 NOTE — Progress Notes (Signed)
I,Sha'taria Tyson,acting as a Neurosurgeon for [employer], DO.,have documented all relevant documentation on the behalf of [employer], DO,as directed by  [employer], DO while in the presence of Mckenley Birenbaum N Martina Brodbeck, DO.   New patient visit   Patient: Amber Edwards   DOB: 1975-06-10   47 y.o. Female  MRN: [id number] Visit Date: 10/18/2022  Today's healthcare provider: Sherlyn Hay, DO   Chief Complaint  Patient presents with   Establish Care   IUD REMOVAL   Subjective    Amber Edwards is a 47 y.o. female who presents today as a new patient to establish care.  HPI  -HIV Screening: patient reports completed one year ago -Hepatitis C Screening: patient reports completed one year ago -Pap Smear: unknown of last but [facility] [place] [facility] she lived there (many years ago) -Tetanus Vaccine: declined -Colonoscopy: declined -Needs IUD removed was inserted in 2005 at [place] - having sporadic periods at this time. -Would like to discuss weight loss options  Anxiety/depression - both are affecting her more than in previous years. 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  - Very hateful to husband and recognizes it.  - Becomes overwhelmed really easily. - Doesn't watch her grandkids anymore because of it. - Thinks might be going through menopause. Endorses hot flashes and having trouble sleeping, though she slept decently last night. - Tosses and turns at 0200-0300 in the morning. She feels like she can't stop her brain, is thinking about 1500 things at once and can't focus on a single thing.  BP high today - at home, it's usually 130s/80s, occasional 120s/70s, but she has been less consistent about checking lately.  Having vaginal discharge, some itching and malodor. Was recently on amoxicillin from her dentist.  Currently has one sexual partner but has no urge to engage in intercourse. Denies concerns regarding STIs.  COPD  -patient does experience intermittent chest tightness and shortness of breath.  She is not taking anything for this at the moment, as she has not been to a physician in many years. - Over the years, she has been on Spiriva, Breo Ellipta, Incruse Ellipta, Symbicort, albuterol and an oral medication for her COPD. - Sleeps on 3 pillows; becomes SOB when flat and starts wheezing  PMH GERD- uses generic nexium regularly, which only minimally improves her reflux.      10/18/2022   10:09 AM  GAD 7 : Generalized Anxiety Score  Nervous, Anxious, on Edge 2  Control/stop worrying 3  Worry too much - different things 3  Trouble relaxing 3  Restless 2  Easily annoyed or irritable 3  Afraid - awful might happen 1  Total GAD 7 Score 17  Anxiety Difficulty Extremely difficult        10/18/2022   10:08 AM  Depression screen PHQ 2/9  Decreased Interest 2  Down, Depressed, Hopeless 1  PHQ - 2 Score 3  Altered sleeping 3  Tired, decreased energy 3  Change in appetite 2  Feeling bad or failure about yourself  1  Trouble concentrating 3  Moving slowly or fidgety/restless 0  Suicidal thoughts 0  PHQ-9 Score 15  Difficult doing work/chores Very difficult      Past Medical History:  Diagnosis Date   Acid reflux    Allergy    Anemia    Anxiety    Back problem    Carpal tunnel syndrome  COPD (chronic obstructive pulmonary disease) (HCC)    Sciatica    Past Surgical History:  Procedure Laterality Date   left knee surgery     Family Status  Relation Name Status   Mother Zack Seal Deceased   Father Valora Rojek Sr (Not Specified)   Mat Aunt  (Not Specified)   MGM  (Not Specified)   PGF  (Not Specified)   Cousin  (Not Specified)   Family History  Problem Relation Age of Onset   Cancer Mother 78 - 32       Breast cancer developed into bone   COPD Father    Obesity Father    Heart failure Father 38 - 73   Breast cancer Maternal Aunt    Memory loss Maternal Aunt     Emphysema Maternal Grandmother    Diabetes Paternal Grandfather    Heart disease Paternal Grandfather    Lung cancer Cousin    Social History   Socioeconomic History   Marital status: Married    Spouse name: Not on file   Number of children: 2   Years of education: Not on file   Highest education level: Not on file  Occupational History   Not on file  Tobacco Use   Smoking status: Every Day    Packs/day: 1    Types: Cigarettes   Smokeless tobacco: Never   Tobacco comments:    Smoking since 47 yrs old  Vaping Use   Vaping Use: Never used  Substance and Sexual Activity   Alcohol use: No   Drug use: No   Sexual activity: Yes    Birth control/protection: I.U.D.    Comment: IUD is old and needs to be removed  Other Topics Concern   Not on file  Social History Narrative   Not on file   Social Determinants of Health   Financial Resource Strain: Not on file  Food Insecurity: Not on file  Transportation Needs: Not on file  Physical Activity: Not on file  Stress: Not on file  Social Connections: Not on file   Outpatient Medications Prior to Visit  Medication Sig   [DISCONTINUED] albuterol (PROVENTIL HFA;VENTOLIN HFA) 108 (90 Base) MCG/ACT inhaler Inhale 2 puffs into the lungs every 6 (six) hours as needed for wheezing or shortness of breath.   [DISCONTINUED] azithromycin (ZITHROMAX) 250 MG tablet Take 1 tablet (250 mg total) by mouth daily. (Patient not taking: Reported on 10/18/2022)   [DISCONTINUED] cyclobenzaprine (FLEXERIL) 10 MG tablet Take 1 tablet (10 mg total) by mouth 2 (two) times daily as needed for muscle spasms. (Patient not taking: Reported on 10/18/2022)   [DISCONTINUED] guaiFENesin-codeine 100-10 MG/5ML syrup Take 5 mLs by mouth every 6 (six) hours as needed for cough. (Patient not taking: Reported on 10/18/2022)   [DISCONTINUED] HYDROcodone-acetaminophen (NORCO/VICODIN) 5-325 MG tablet Take 2 tablets by mouth every 4 (four) hours as needed. (Patient not taking:  Reported on 10/18/2022)   [DISCONTINUED] omeprazole (PRILOSEC) 40 MG capsule Take 1 capsule (40 mg total) by mouth daily.   No facility-administered medications prior to visit.   No Known Allergies  Immunization History  Administered Date(s) Administered   Td 08/17/1993    Health Maintenance  Topic Date Due   PAP SMEAR-Modifier  Never done   COVID-19 Vaccine (1) 11/03/2022 (Originally 05[birth year]29/1975)   Colonoscopy  11/11/2022 (Originally 11/26/2019)   Hepatitis C Screening  11/11/2022 (Originally 11/25/1992)   HIV Screening  11/11/2022 (Originally 11/25/1989)   INFLUENZA VACCINE  12/19/2022   HPV VACCINES  Aged Out   DTaP/Tdap/Td  Discontinued    Patient Care Team: Telly Broberg, Monico Blitz, DO as PCP - General (Family Medicine)  Review of Systems  Constitutional:  Positive for activity change, diaphoresis and fatigue.  HENT:  Positive for congestion, sinus pressure and sneezing.   Eyes:  Positive for itching and visual disturbance.  Respiratory:  Positive for cough, shortness of breath and wheezing.   Cardiovascular:  Positive for palpitations.  Gastrointestinal:  Positive for abdominal distention.  Musculoskeletal:  Positive for arthralgias and back pain.  Allergic/Immunologic: Positive for environmental allergies.  Neurological:  Positive for light-headedness, numbness and headaches.  Psychiatric/Behavioral:  Positive for decreased concentration and sleep disturbance. The patient is nervous/anxious.        Objective    BP 138/86 (BP Location: Right Arm, Patient Position: Sitting, Cuff Size: Normal)   Pulse 80   Ht 5\' 4"  (1.626 m)   Wt 270 lb 11.2 oz (122.8 kg)   LMP 09/23/2022 (Approximate)   BMI 46.47 kg/m    Physical Exam Vitals reviewed.  Constitutional:      General: She is not in acute distress.    Appearance: Normal appearance. She is well-developed. She is not diaphoretic.  HENT:     Head: Normocephalic and atraumatic.  Eyes:     General: No scleral icterus.     Conjunctiva/sclera: Conjunctivae normal.  Neck:     Thyroid: No thyromegaly.  Cardiovascular:     Rate and Rhythm: Normal rate and regular rhythm.     Pulses: Normal pulses.     Heart sounds: Normal heart sounds. No murmur heard. Pulmonary:     Effort: Pulmonary effort is normal. No respiratory distress.     Breath sounds: Normal breath sounds. No wheezing, rhonchi or rales.  Abdominal:     Hernia: There is no hernia in the left inguinal area or right inguinal area.  Genitourinary:    General: Normal vulva.     Exam position: Lithotomy position.     Pubic Area: No rash or pubic lice.      Labia:        Right: No rash, tenderness, lesion or injury.        Left: No rash, tenderness, lesion or injury.      Urethra: No prolapse, urethral pain, urethral swelling or urethral lesion.     Vagina: Vaginal discharge (yellowish white, thin, malodorous) present.     Cervix: No cervical motion tenderness or friability.     Uterus: Normal.      Adnexa: Right adnexa normal and left adnexa normal.  Musculoskeletal:     Right lower leg: No edema.     Left lower leg: No edema.  Lymphadenopathy:     Lower Body: No right inguinal adenopathy. No left inguinal adenopathy.  Skin:    General: Skin is warm and dry.     Findings: No rash.  Neurological:     Mental Status: She is alert and oriented to person, place, and time. Mental status is at baseline.  Psychiatric:        Mood and Affect: Mood normal.        Behavior: Behavior normal.     Depression Screen    10/18/2022   10:08 AM  PHQ 2/9 Scores  PHQ - 2 Score 3  PHQ- 9 Score 15   No results found for any visits on 10/18/22.  Assessment & Plan     1. Women's annual routine gynecological examination Routine gynecological exam performed today;  copious discharge noted on exam.  Sample obtained for Pap smear and to test for bacterial vaginosis/candidal vaginitis.  Will call patient with results and treat as appropriate. - Cytology -  PAP  2. Mucopurulent chronic bronchitis (HCC) Patient has been diagnosed with chronic bronchitis with productive sputum.  She has not had any exacerbations requiring hospitalization in the past year.  She has previously used multiple medications, with the most success noted using Spiriva; this is not covered under her current insurance plan.  Will order Stiolto as noted below, as well as a new albuterol rescue inhaler. - Tiotropium Bromide-Olodaterol 2.5-2.5 MCG/ACT AERS; Inhale 2 puffs into the lungs daily.  Dispense: 1 each; Refill: 1 - albuterol (VENTOLIN HFA) 108 (90 Base) MCG/ACT inhaler; Inhale 1-2 puffs into the lungs every 4 (four) hours as needed for wheezing (or cough).  Dispense: 1 each; Refill: 1  3. Generalized anxiety disorder Patient has significant anxiety which severely impacts her day-to-day life, including leading to anger at her husband and other people around her, as well as interfering with her sleep.  Will start her on escitalopram as noted below patient to return in 4 weeks for recheck. - escitalopram (LEXAPRO) 10 MG tablet; Take 1 tablet (10 mg total) by mouth daily.  Dispense: 30 tablet; Refill: 1  4. Severe episode of recurrent major depressive disorder, without psychotic features [facility]) Patient has severe depression as noted above.  Will treat this along with her anxiety using escitalopram.  Patient to return in 4 weeks for recheck. - escitalopram (LEXAPRO) 10 MG tablet; Take 1 tablet (10 mg total) by mouth daily.  Dispense: 30 tablet; Refill: 1  5. Chronic GERD Patient endorses chronic GERD for which she has been taking OTC esomeprazole.  Discussed with her that we would treat her with Protonix for 1 month as noted, then trial her off of it, due to increased risks associated with being on it chronically. - pantoprazole (PROTONIX) 40 MG tablet; Take 1 tablet (40 mg total) by mouth daily.  Dispense: 30 tablet; Refill: 1  6. Encounter for screening mammogram for breast  cancer Family history of breast cancer in the patient's mother and aunt.  Screening mammogram ordered as noted below. - MM 3D SCREENING MAMMOGRAM BILATERAL BREAST; Future  7. Unsuccessful attempt to remove intrauterine device (IUD) Attempted to remove patient's intrauterine device which had been originally placed in 2005.  Multiple attempts to locate the string and remove the IUD were unsuccessful.  Ordered ultrasound as noted below to confirm that it is in fact still present.  If so, as discussed with patient, will refer her to gynecology for removal. - US Pelvis Limited; Future  8. Colon cancer screening Patient declines colonoscopy but was willing to do Cologuard screening.  Sent as noted below. - Cologuard  9. Vaginal discharge Copious thin yellowish-white vaginal discharge noted on exam with malodor.  Will send cervical vaginal swab to check for bacterial vaginosis and Candida albicans and treat based on results. - Cervicovaginal ancillary only  10. Morbid obesity with BMI of 45.0-49.9, adult (HCC) Unable to fully discuss this during the visit; will address at next visit.  11. Nicotine dependence with current use Patient was emphatically unwilling to quit smoking, though she noted she might be willing to if/after she is able to lose weight.  12. Sleep apnea in adult Patient does endorse stopping breathing while sleeping and requires 3 pillows to sleep.  She was supposed to complete a sleep study years ago but never did.  Will order sleep study as noted below. - Home sleep test; Future   Return in about 4 weeks (around 11/15/2022) for anx/dep; additional health topics.     The entirety of the information documented in the History of Present Illness, Review of Systems and Physical Exam were personally obtained by me. Portions of this information were initially documented by the CMA,Sha'taria Chales Abrahams, and reviewed by me for thoroughness and accuracy.   I discussed the assessment and  treatment plan with the patient  The patient was provided an opportunity to ask questions and all were answered. The patient agreed with the plan and demonstrated an understanding of the instructions.   The patient was advised to call back or seek an in-person evaluation if the symptoms worsen or if the condition fails to improve as anticipated.    Sherlyn Hay, DO  [facility] [phone] (phone) [phone] (fax)  [facility]

## 2022-10-18 ENCOUNTER — Ambulatory Visit (INDEPENDENT_AMBULATORY_CARE_PROVIDER_SITE_OTHER): Payer: BC Managed Care – PPO | Admitting: Family Medicine

## 2022-10-18 ENCOUNTER — Other Ambulatory Visit (HOSPITAL_COMMUNITY)
Admission: RE | Admit: 2022-10-18 | Discharge: 2022-10-18 | Disposition: A | Discharge status: Home / Self Care | Payer: BC Managed Care – PPO | Source: Ambulatory Visit | Attending: Family Medicine | Admitting: Family Medicine

## 2022-10-18 ENCOUNTER — Encounter: Payer: Self-pay | Admitting: Family Medicine

## 2022-10-18 VITALS — BP 138/86 | HR 80 | Ht 64.0 in | Wt 270.7 lb

## 2022-10-18 DIAGNOSIS — Z1211 Encounter for screening for malignant neoplasm of colon: Secondary | ICD-10-CM

## 2022-10-18 DIAGNOSIS — F17200 Nicotine dependence, unspecified, uncomplicated: Secondary | ICD-10-CM

## 2022-10-18 DIAGNOSIS — F332 Major depressive disorder, recurrent severe without psychotic features: Secondary | ICD-10-CM | POA: Diagnosis not present

## 2022-10-18 DIAGNOSIS — F411 Generalized anxiety disorder: Secondary | ICD-10-CM | POA: Diagnosis not present

## 2022-10-18 DIAGNOSIS — J411 Mucopurulent chronic bronchitis: Secondary | ICD-10-CM | POA: Diagnosis not present

## 2022-10-18 DIAGNOSIS — Z01419 Encounter for gynecological examination (general) (routine) without abnormal findings: Secondary | ICD-10-CM | POA: Insufficient documentation

## 2022-10-18 DIAGNOSIS — G4730 Sleep apnea, unspecified: Secondary | ICD-10-CM

## 2022-10-18 DIAGNOSIS — B372 Candidiasis of skin and nail: Secondary | ICD-10-CM | POA: Diagnosis present

## 2022-10-18 DIAGNOSIS — Z538 Procedure and treatment not carried out for other reasons: Secondary | ICD-10-CM

## 2022-10-18 DIAGNOSIS — K219 Gastro-esophageal reflux disease without esophagitis: Secondary | ICD-10-CM | POA: Insufficient documentation

## 2022-10-18 DIAGNOSIS — N898 Other specified noninflammatory disorders of vagina: Secondary | ICD-10-CM

## 2022-10-18 DIAGNOSIS — Z1231 Encounter for screening mammogram for malignant neoplasm of breast: Secondary | ICD-10-CM

## 2022-10-18 MED ORDER — ALBUTEROL SULFATE HFA 108 (90 BASE) MCG/ACT IN AERS: 1.0000 | INHALATION_SPRAY | RESPIRATORY_TRACT | 1 refills | Status: DC | PRN

## 2022-10-18 MED ORDER — ESCITALOPRAM OXALATE 10 MG PO TABS: 10.0000 mg | ORAL_TABLET | Freq: Every day | ORAL | 1 refills | Status: DC

## 2022-10-18 MED ORDER — TIOTROPIUM BROMIDE-OLODATEROL 2.5-2.5 MCG/ACT IN AERS: 2.0000 | INHALATION_SPRAY | Freq: Every day | RESPIRATORY_TRACT | 1 refills | Status: DC

## 2022-10-18 MED ORDER — PANTOPRAZOLE SODIUM 40 MG PO TBEC: 40.0000 mg | DELAYED_RELEASE_TABLET | Freq: Every day | ORAL | 1 refills | Status: DC

## 2022-10-21 ENCOUNTER — Other Ambulatory Visit: Payer: Self-pay | Admitting: Family Medicine

## 2022-10-21 DIAGNOSIS — B9689 Other specified bacterial agents as the cause of diseases classified elsewhere: Secondary | ICD-10-CM

## 2022-10-21 LAB — CERVICOVAGINAL ANCILLARY ONLY
Bacterial Vaginitis (gardnerella): POSITIVE — AB
Candida Glabrata: NEGATIVE
Candida Vaginitis: NEGATIVE
Comment: NEGATIVE
Comment: NEGATIVE
Comment: NEGATIVE

## 2022-10-21 MED ORDER — METRONIDAZOLE 500 MG PO TABS: 500.0000 mg | ORAL_TABLET | Freq: Two times a day (BID) | ORAL | 0 refills | Status: DC

## 2022-10-23 LAB — CYTOLOGY - PAP
Adequacy: ABSENT
Comment: NEGATIVE
Diagnosis: NEGATIVE
High risk HPV: NEGATIVE

## 2022-11-12 ENCOUNTER — Ambulatory Visit: Admission: RE | Admit: 2022-11-12 | Payer: BC Managed Care – PPO | Source: Ambulatory Visit

## 2022-11-15 ENCOUNTER — Ambulatory Visit: Payer: BC Managed Care – PPO | Admitting: Family Medicine

## 2023-01-04 ENCOUNTER — Other Ambulatory Visit: Payer: Self-pay | Admitting: Family Medicine

## 2023-01-04 DIAGNOSIS — J411 Mucopurulent chronic bronchitis: Secondary | ICD-10-CM

## 2023-01-04 DIAGNOSIS — K219 Gastro-esophageal reflux disease without esophagitis: Secondary | ICD-10-CM

## 2023-03-14 ENCOUNTER — Ambulatory Visit (INDEPENDENT_AMBULATORY_CARE_PROVIDER_SITE_OTHER): Payer: BC Managed Care – PPO | Admitting: Family Medicine

## 2023-03-14 ENCOUNTER — Encounter: Payer: Self-pay | Admitting: Family Medicine

## 2023-03-14 VITALS — BP 153/73 | HR 87 | Resp 16 | Ht 64.0 in | Wt 274.6 lb

## 2023-03-14 DIAGNOSIS — J411 Mucopurulent chronic bronchitis: Secondary | ICD-10-CM

## 2023-03-14 DIAGNOSIS — M5440 Lumbago with sciatica, unspecified side: Secondary | ICD-10-CM

## 2023-03-14 DIAGNOSIS — Z0001 Encounter for general adult medical examination with abnormal findings: Secondary | ICD-10-CM

## 2023-03-14 DIAGNOSIS — F17200 Nicotine dependence, unspecified, uncomplicated: Secondary | ICD-10-CM | POA: Diagnosis not present

## 2023-03-14 DIAGNOSIS — K219 Gastro-esophageal reflux disease without esophagitis: Secondary | ICD-10-CM

## 2023-03-14 DIAGNOSIS — Z716 Tobacco abuse counseling: Secondary | ICD-10-CM | POA: Diagnosis not present

## 2023-03-14 DIAGNOSIS — Z0000 Encounter for general adult medical examination without abnormal findings: Secondary | ICD-10-CM

## 2023-03-14 MED ORDER — PANTOPRAZOLE SODIUM 40 MG PO TBEC: 40.0000 mg | DELAYED_RELEASE_TABLET | Freq: Every day | ORAL | 1 refills | Status: DC

## 2023-03-14 MED ORDER — NICOTINE POLACRILEX 2 MG MT LOZG: 2.0000 mg | LOZENGE | OROMUCOSAL | 0 refills | Status: DC | PRN

## 2023-03-14 MED ORDER — BUDESONIDE-FORMOTEROL FUMARATE 160-4.5 MCG/ACT IN AERO: 2.0000 | INHALATION_SPRAY | Freq: Two times a day (BID) | RESPIRATORY_TRACT | 5 refills | Status: DC

## 2023-03-14 MED ORDER — ALBUTEROL SULFATE HFA 108 (90 BASE) MCG/ACT IN AERS: 2.0000 | INHALATION_SPRAY | RESPIRATORY_TRACT | 5 refills | Status: DC | PRN

## 2023-03-14 NOTE — Progress Notes (Unsigned)
Established patient visit   Patient: Amber Edwards   DOB: 13-Oct-1975   47 y.o. Female  MRN: [id number] Visit Date: 03/14/2023  Today's healthcare provider: Sherlyn Hay, DO   No chief complaint on file.  Subjective    HPI Needs refill of inhalers. Complains of numbness going from her lower back to her feet.   Did not take sleep apnea test due to them requiring $100 deposit until they received the machine back. ESS: 6 Witnessed apneic episodes Sleep with 3 pillows  Cologuard received?  Yes; she just hasn't done it yet.  Back pain/sciatica:  - Started working out.   - Numbness in low back has made it worse, now extending down  the [place] feet.  - back also hurts a lot standing washing dishes or while walking for a long period of time.  - had previously gotten to the point where she was going to have an MRI but wasn't able to because of an insurance change.  - causes her to be unable to sweep, mop or vacuum as pain will suddenly occur.  Bilateral Upper Extremity Nerve Pain: Previously had nerve conduction study in both arms. Also told severe carpal tunnel that was bad enough that she was told surgery wouldn't help. Had right shoulder injury several years ago - rotator cuff tear. Never did anything about it except for being seen in the ER.  - took [facility] powder, ibuprofen and tylenol.  COPD: Stiolto was not effective in managing symptoms  Nicotine:  - quit cold [place] for a year previously  - not interested in chantix or wellbutrin     {History (Optional):23778}  Medications: Outpatient Medications Prior to Visit  Medication Sig  . albuterol (VENTOLIN HFA) 108 (90 Base) MCG/ACT inhaler INHALE 1 TO 2 PUFFS BY MOUTH INTO THE LUNGS EVERY 4 HOURS AS NEEDED FOR WHEEZING OR COUGH  . escitalopram (LEXAPRO) 10 MG tablet Take 1 tablet (10 mg total) by mouth daily.  . metroNIDAZOLE (FLAGYL) 500 MG tablet Take 1 tablet (500 mg total) by mouth 2 (two) times daily.  .  pantoprazole (PROTONIX) 40 MG tablet TAKE ONE TABLET BY MOUTH DAILY  . Tiotropium Bromide-Olodaterol 2.5-2.5 MCG/ACT AERS Inhale 2 puffs into the lungs daily.   No facility-administered medications prior to visit.    Review of Systems  ***  {Insert previous labs (optional):23779} {See past labs  Heme  Chem  Endocrine  Serology  Results Review (optional):1}   Objective    There were no vitals taken for this visit. {Insert last BP/Wt (optional):23777}{See vitals history (optional):1}   Physical Exam Constitutional:      Appearance: Normal appearance.  HENT:     Head: Normocephalic and atraumatic.  Eyes:     General: No scleral icterus.    Extraocular Movements: Extraocular movements intact.     Conjunctiva/sclera: Conjunctivae normal.  Cardiovascular:     Rate and Rhythm: Normal rate and regular rhythm.     Pulses: Normal pulses.     Heart sounds: Normal heart sounds.  Pulmonary:     Effort: Pulmonary effort is normal. No respiratory distress.     Breath sounds: Normal breath sounds.  Abdominal:     General: Bowel sounds are normal. There is no distension.     Palpations: Abdomen is soft. There is no mass.     Tenderness: There is no abdominal tenderness. There is no guarding.  Musculoskeletal:     Right lower leg: No edema.  Left lower leg: No edema.  Skin:    General: Skin is warm and dry.  Neurological:     Mental Status: She is alert and oriented to person, place, and time. Mental status is at baseline.  Psychiatric:        Mood and Affect: Mood normal.        Behavior: Behavior normal.     No results found for any visits on 03/14/23.  Assessment & Plan    There are no diagnoses linked to this encounter.   ***  No follow-ups on file.      I discussed the assessment and treatment plan with the patient  The patient was provided an opportunity to ask questions and all were answered. The patient agreed with the plan and demonstrated an understanding  of the instructions.   The patient was advised to call back or seek an in-person evaluation if the symptoms worsen or if the condition fails to improve as anticipated.    Sherlyn Hay, DO  [facility] [phone] (phone) [phone] (fax)  [facility]

## 2023-03-20 ENCOUNTER — Encounter: Payer: Self-pay | Admitting: Family Medicine

## 2023-03-20 DIAGNOSIS — Z0000 Encounter for general adult medical examination without abnormal findings: Secondary | ICD-10-CM | POA: Insufficient documentation

## 2023-03-20 DIAGNOSIS — M5440 Lumbago with sciatica, unspecified side: Secondary | ICD-10-CM | POA: Insufficient documentation

## 2023-03-20 DIAGNOSIS — Z716 Tobacco abuse counseling: Secondary | ICD-10-CM | POA: Insufficient documentation

## 2023-03-20 DIAGNOSIS — F17200 Nicotine dependence, unspecified, uncomplicated: Secondary | ICD-10-CM | POA: Insufficient documentation

## 2023-03-20 NOTE — Assessment & Plan Note (Signed)
Refill

## 2023-03-20 NOTE — Assessment & Plan Note (Signed)
Physical exam overall unremarkable except as noted above. Routine lab work ordered as noted.

## 2023-03-20 NOTE — Assessment & Plan Note (Signed)
Given patient's history of longstanding back pain/sciatica with previous workup leading up to an MRI being ordered by orthopedics, though not completed due to insurance, I will go ahead and refer her to orthopedics today to resume her workup.

## 2023-03-20 NOTE — Assessment & Plan Note (Signed)
Patient wants to quit smoking but is concerned about using nicotine patches and prefers to try to taper down her cigarettes on her own.  I suggested we also do nicotine lozenges to occasionally supplement when she feels she absolutely needs a cigarette, to which patient was amenable.

## 2023-03-21 ENCOUNTER — Telehealth: Payer: Self-pay | Admitting: Family Medicine

## 2023-03-21 NOTE — Telephone Encounter (Signed)
Patient stated that she was returning your phone call to discuss weight loss injections.

## 2023-03-25 ENCOUNTER — Telehealth: Payer: Self-pay

## 2023-03-25 NOTE — Telephone Encounter (Unsigned)
Copied from CRM [id number]. Topic: General - Inquiry >> Mar 25, 2023  4:17 PM Runell Gess P wrote: Reason for CRM: pt called back because she has not heard anything back regarding talking to her about the weight loss medication.  Cb@  [phone]

## 2023-03-26 ENCOUNTER — Telehealth: Payer: Self-pay | Admitting: Family Medicine

## 2023-03-26 MED ORDER — SEMAGLUTIDE-WEIGHT MANAGEMENT 0.25 MG/0.5ML ~~LOC~~ SOAJ: 0.2500 mg | SUBCUTANEOUS | 0 refills | Status: DC

## 2023-03-26 NOTE — Telephone Encounter (Signed)
[facility] is requesting prior authorization Key: [id number] Wegovy 0.25mg /0.58ml auto injectors

## 2023-03-27 NOTE — Telephone Encounter (Signed)
PA sent to plan Note: No mention of obesity on providers notes/or weight loss regimen patient has tried or is currently on.

## 2023-03-27 NOTE — Telephone Encounter (Signed)
Spoke with patient on 02/22/2023 and discussed her current efforts to reduce her caloric intake and eat healthy overall.  Her exercise efforts have been limited by her chronic pain, with whom she will be seeing orthopedics.  Did discuss and agreed to send Ozempic as noted.  Patient will follow-up in 6 weeks to assess tolerance and efficacy.

## 2023-03-31 ENCOUNTER — Encounter: Payer: Self-pay | Admitting: Family Medicine

## 2023-05-16 ENCOUNTER — Ambulatory Visit: Payer: BC Managed Care – PPO | Admitting: Family Medicine

## 2023-05-16 NOTE — Progress Notes (Deleted)
      Established patient visit   Patient: Amber Edwards   DOB: Oct 03, 1975   47 y.o. Female  MRN: [id number] Visit Date: 05/16/2023  Today's healthcare provider: Sherlyn Hay, DO   No chief complaint on file.  Subjective    HPI Last annual exam 03/14/2023   Follow-up on weight loss medication options Previously attempted to prescribe Wegovy, but it was denied.  Current options include Wellbutrin/naltrexone combination, phentermine once her blood pressure is better controlled, topiramate, lisdexamfetamine if binge eating (also requiring a better blood pressure control)   ***  {History (Optional):23778}  Medications: Outpatient Medications Prior to Visit  Medication Sig   albuterol (VENTOLIN HFA) 108 (90 Base) MCG/ACT inhaler Inhale 2 puffs into the lungs every 4 (four) hours as needed for wheezing or shortness of breath.   budesonide-formoterol (SYMBICORT) 160-4.5 MCG/ACT inhaler Inhale 2 puffs into the lungs 2 (two) times daily.   nicotine polacrilex (COMMIT) 2 MG lozenge Take 1 lozenge (2 mg total) by mouth as needed for smoking cessation.   pantoprazole (PROTONIX) 40 MG tablet Take 1 tablet (40 mg total) by mouth daily.   Semaglutide-Weight Management 0.25 MG/0.5ML SOAJ Inject 0.25 mg into the skin once a week for 28 days.   No facility-administered medications prior to visit.    Review of Systems ***  {Insert previous labs (optional):23779} {See past labs  Heme  Chem  Endocrine  Serology  Results Review (optional):1}   Objective    There were no vitals taken for this visit. {Insert last BP/Wt (optional):23777}{See vitals history (optional):1}   Physical Exam   No results found for any visits on 05/16/23.  Assessment & Plan    There are no diagnoses linked to this encounter.  ***  No follow-ups on file.      I discussed the assessment and treatment plan with the patient  The patient was provided an opportunity to ask questions and all were  answered. The patient agreed with the plan and demonstrated an understanding of the instructions.   The patient was advised to call back or seek an in-person evaluation if the symptoms worsen or if the condition fails to improve as anticipated.    Sherlyn Hay, DO  [facility] [phone] (phone) [phone] (fax)  [facility]

## 2023-07-22 ENCOUNTER — Telehealth: Admitting: Family Medicine

## 2023-07-22 DIAGNOSIS — J208 Acute bronchitis due to other specified organisms: Secondary | ICD-10-CM

## 2023-07-22 MED ORDER — PREDNISONE 20 MG PO TABS: 40.0000 mg | ORAL_TABLET | Freq: Every day | ORAL | 0 refills | Status: DC

## 2023-07-22 MED ORDER — PSEUDOEPH-BROMPHEN-DM 30-2-10 MG/5ML PO SYRP: 5.0000 mL | ORAL_SOLUTION | Freq: Four times a day (QID) | ORAL | 0 refills | Status: DC | PRN

## 2023-07-22 NOTE — Progress Notes (Signed)
 E-Visit for Cough   We are sorry that you are not feeling well.  Here is how we plan to help!  Based on your presentation I believe you most likely have A cough due to a virus.  This is called viral bronchitis and is best treated by rest, plenty of fluids and control of the cough.  You may use Ibuprofen or Tylenol as directed to help your symptoms.     In addition you may use Bromfed DM cough syrup Take 5mL every 6 hours as needed for cough.  Prednisone 20mg  Take 2 tablets (40mg ) daily for 7 days.  From your responses in the eVisit questionnaire you describe inflammation in the upper respiratory tract which is causing a significant cough.  This is commonly called Bronchitis and has four common causes:   Allergies Viral Infections Acid Reflux Bacterial Infection Allergies, viruses and acid reflux are treated by controlling symptoms or eliminating the cause. An example might be a cough caused by taking certain blood pressure medications. You stop the cough by changing the medication. Another example might be a cough caused by acid reflux. Controlling the reflux helps control the cough.  USE OF BRONCHODILATOR ("RESCUE") INHALERS: There is a risk from using your bronchodilator too frequently.  The risk is that over-reliance on a medication which only relaxes the muscles surrounding the breathing tubes can reduce the effectiveness of medications prescribed to reduce swelling and congestion of the tubes themselves.  Although you feel brief relief from the bronchodilator inhaler, your asthma may actually be worsening with the tubes becoming more swollen and filled with mucus.  This can delay other crucial treatments, such as oral steroid medications. If you need to use a bronchodilator inhaler daily, several times per day, you should discuss this with your provider.  There are probably better treatments that could be used to keep your asthma under control.     HOME CARE Only take medications as  instructed by your medical team. Complete the entire course of an antibiotic. Drink plenty of fluids and get plenty of rest. Avoid close contacts especially the very young and the elderly Cover your mouth if you cough or cough into your sleeve. Always remember to wash your hands A steam or ultrasonic humidifier can help congestion.   GET HELP RIGHT AWAY IF: You develop worsening fever. You become short of breath You cough up blood. Your symptoms persist after you have completed your treatment plan MAKE SURE YOU  Understand these instructions. Will watch your condition. Will get help right away if you are not doing well or get worse.    Thank you for choosing an e-visit.  Your e-visit answers were reviewed by a board certified advanced clinical practitioner to complete your personal care plan. Depending upon the condition, your plan could have included both over the counter or prescription medications.  Please review your pharmacy choice. Make sure the pharmacy is open so you can pick up prescription now. If there is a problem, you may contact your provider through [employer] and have the prescription routed to another pharmacy.  Your safety is important to [place]. If you have drug allergies check your prescription carefully.   For the next 24 hours you can use MyChart to ask questions about today's visit, request a non-urgent call back, or ask for a work or school excuse. You will get an email in the next two days asking about your experience. I hope that your e-visit has been valuable and will speed  your recovery.   I have spent 5 minutes in review of e-visit questionnaire, review and updating patient chart, medical decision making and response to patient.   Margaretann Loveless, PA-C

## 2023-07-29 ENCOUNTER — Other Ambulatory Visit: Payer: Self-pay | Admitting: Family Medicine

## 2023-07-29 DIAGNOSIS — J411 Mucopurulent chronic bronchitis: Secondary | ICD-10-CM

## 2023-07-30 NOTE — Telephone Encounter (Signed)
 Requested Prescriptions  Pending Prescriptions Disp Refills   albuterol (VENTOLIN HFA) 108 (90 Base) MCG/ACT inhaler [Pharmacy Med Name: ALBUTEROL SULFATE HFA 108 AERS] 8.5 each 5    Sig: INHALE 2 PUFFS INTO THE LUNGS EVERY 4 HOURS AS NEEDED FOR WHEEZING OR SHORTNESS OF BREATH.     Pulmonology:  Beta Agonists 2 Failed - 07/30/2023 12:18 PM      Failed - Last BP in normal range    BP Readings from Last 1 Encounters:  03/14/23 (!) 153/73         Passed - Last Heart Rate in normal range    Pulse Readings from Last 1 Encounters:  03/14/23 87         Passed - Valid encounter within last 12 months    Recent Outpatient Visits           4 months ago Annual physical exam   [facility] [place], Monico Blitz, DO   9 months ago [employer] annual routine gynecological examination   [facility] [place], Monico Blitz, DO

## 2023-09-25 ENCOUNTER — Other Ambulatory Visit: Payer: Self-pay | Admitting: Family Medicine

## 2023-09-25 DIAGNOSIS — K219 Gastro-esophageal reflux disease without esophagitis: Secondary | ICD-10-CM

## 2023-11-08 ENCOUNTER — Other Ambulatory Visit: Payer: Self-pay | Admitting: Family Medicine

## 2023-11-08 DIAGNOSIS — J411 Mucopurulent chronic bronchitis: Secondary | ICD-10-CM

## 2023-11-11 ENCOUNTER — Ambulatory Visit: Payer: Self-pay

## 2023-11-11 ENCOUNTER — Other Ambulatory Visit: Payer: Self-pay | Admitting: Family Medicine

## 2023-11-11 DIAGNOSIS — J411 Mucopurulent chronic bronchitis: Secondary | ICD-10-CM

## 2023-11-11 MED ORDER — ALBUTEROL SULFATE HFA 108 (90 BASE) MCG/ACT IN AERS: 2.0000 | INHALATION_SPRAY | RESPIRATORY_TRACT | 2 refills | Status: DC | PRN

## 2023-11-11 NOTE — Telephone Encounter (Signed)
 Requested by patient last OV 03/14/23.  Requested Prescriptions  Pending Prescriptions Disp Refills   albuterol  (VENTOLIN  HFA) 108 (90 Base) MCG/ACT inhaler 8.5 each 2    Sig: Inhale 2 puffs into the lungs every 4 (four) hours as needed for wheezing or shortness of breath.     Pulmonology:  Beta Agonists 2 Failed - 11/11/2023 10:32 AM      Failed - Last BP in normal range    BP Readings from Last 1 Encounters:  03/14/23 (!) 153/73         Failed - Valid encounter within last 12 months    Recent Outpatient Visits   None            Passed - Last Heart Rate in normal range    Pulse Readings from Last 1 Encounters:  03/14/23 87

## 2023-11-11 NOTE — Telephone Encounter (Signed)
 Copied from CRM [id number]. Topic: Clinical - Red Word Triage >> Nov 11, 2023  9:23 AM Antwanette L wrote: Red Word that prompted transfer to Nurse Triage: Wheezing Reason for Disposition . [1] Prescription refill request for ESSENTIAL medicine (i.e., likelihood of harm to patient if not taken) AND [2] triager unable to refill per department policy  Answer Assessment - Initial Assessment Questions 1. RESPIRATORY STATUS: Describe your breathing? (e.g., wheezing, shortness of breath, unable to speak, severe coughing)      Wheeze 2. ONSET: When did this breathing problem begin?      Ran out of inhaler couple weeks ago 3. PATTERN Does the difficult breathing come and go, or has it been constant since it started?      intermittent 4. SEVERITY: How bad is your breathing? (e.g., mild, moderate, severe)    - MILD: No SOB at rest, mild SOB with walking, speaks normally in sentences, can lie down, no retractions, pulse < 100.    - MODERATE: SOB at rest, SOB with minimal exertion and prefers to sit, cannot lie down flat, speaks in phrases, mild retractions, audible wheezing, pulse 100-120.    - SEVERE: Very SOB at rest, speaks in single words, struggling to breathe, sitting hunched forward, retractions, pulse > 120      None right now.  Additional info: Just called for refill, transferred to nurse due to saying her refill is for wheezing  Answer Assessment - Initial Assessment Questions 1. DRUG NAME: What medicine do you need to have refilled?     Albuterol  inhaler 2. REFILLS REMAINING: How many refills are remaining? (Note: The label on the medicine or pill bottle will show how many refills are remaining. If there are no refills remaining, then a renewal may be needed.)     0  3. SYMPTOMS: Do you have any symptoms?     None at this time. History of bronchitis.  Additional info: out of med  Protocols used: Breathing Difficulty-A-AH, Medication Refill and Renewal  Call-A-AH  Reason for Disposition . [1] Prescription refill request for ESSENTIAL medicine (i.e., likelihood of harm to patient if not taken) AND [2] triager unable to refill per department policy  Answer Assessment - Initial Assessment Questions 1. RESPIRATORY STATUS: Describe your breathing? (e.g., wheezing, shortness of breath, unable to speak, severe coughing)      Wheeze 2. ONSET: When did this breathing problem begin?      Ran out of inhaler couple weeks ago 3. PATTERN Does the difficult breathing come and go, or has it been constant since it started?      intermittent 4. SEVERITY: How bad is your breathing? (e.g., mild, moderate, severe)    - MILD: No SOB at rest, mild SOB with walking, speaks normally in sentences, can lie down, no retractions, pulse < 100.    - MODERATE: SOB at rest, SOB with minimal exertion and prefers to sit, cannot lie down flat, speaks in phrases, mild retractions, audible wheezing, pulse 100-120.    - SEVERE: Very SOB at rest, speaks in single words, struggling to breathe, sitting hunched forward, retractions, pulse > 120      None right now.  Additional info: Just called for refill, transferred to nurse due to saying her refill is for wheezing  Answer Assessment - Initial Assessment Questions 1. DRUG NAME: What medicine do you need to have refilled?     Albuterol  inhaler 2. REFILLS REMAINING: How many refills are remaining? (Note: The label on the medicine  or pill bottle will show how many refills are remaining. If there are no refills remaining, then a renewal may be needed.)     0  3. SYMPTOMS: Do you have any symptoms?     None at this time. History of bronchitis.  Additional info: out of med See rx refill encounter  Protocols used: Breathing Difficulty-A-AH, Medication Refill and Renewal Call-A-AH

## 2024-01-02 ENCOUNTER — Other Ambulatory Visit: Payer: Self-pay | Admitting: Family Medicine

## 2024-01-02 DIAGNOSIS — K219 Gastro-esophageal reflux disease without esophagitis: Secondary | ICD-10-CM

## 2024-03-12 ENCOUNTER — Other Ambulatory Visit: Payer: Self-pay | Admitting: Family Medicine

## 2024-03-12 DIAGNOSIS — J411 Mucopurulent chronic bronchitis: Secondary | ICD-10-CM

## 2024-03-15 ENCOUNTER — Other Ambulatory Visit: Payer: Self-pay

## 2024-03-15 DIAGNOSIS — J411 Mucopurulent chronic bronchitis: Secondary | ICD-10-CM

## 2024-03-15 NOTE — Telephone Encounter (Signed)
 Sent to another provider to fill in provider's absence.

## 2024-03-15 NOTE — Telephone Encounter (Signed)
 LOV- 03/14/2023 NOV- 03/17/2024 LRF- 11/11/2023  albuterol  (VENTOLIN  HFA) 108 (90 Base) MCG/ACT inhaler [[id number]]   Order Details Dose: 2 puff Route: Inhalation Frequency: Every 4 hours PRN for wheezing, shortness of breath  Dispense Quantity: 8.5 each Refills: 2   Duration: -- Dispense As Written: No        Sig: Inhale 2 puffs into the lungs every 4 (four) hours as needed for wheezing or shortness of breath.       Start Date: 11/11/23 End Date: --  Written Date: 11/11/23 Expiration Date: 11/10/24

## 2024-03-17 ENCOUNTER — Ambulatory Visit (INDEPENDENT_AMBULATORY_CARE_PROVIDER_SITE_OTHER): Admitting: Family Medicine

## 2024-03-17 ENCOUNTER — Encounter: Payer: Self-pay | Admitting: Family Medicine

## 2024-03-17 VITALS — BP 166/105 | HR 80 | Temp 98.3°F | Ht 62.0 in | Wt 241.2 lb

## 2024-03-17 DIAGNOSIS — Z0000 Encounter for general adult medical examination without abnormal findings: Secondary | ICD-10-CM

## 2024-03-17 DIAGNOSIS — J411 Mucopurulent chronic bronchitis: Secondary | ICD-10-CM | POA: Diagnosis not present

## 2024-03-17 DIAGNOSIS — Z79899 Other long term (current) drug therapy: Secondary | ICD-10-CM | POA: Diagnosis not present

## 2024-03-17 DIAGNOSIS — K219 Gastro-esophageal reflux disease without esophagitis: Secondary | ICD-10-CM

## 2024-03-17 DIAGNOSIS — Z716 Tobacco abuse counseling: Secondary | ICD-10-CM

## 2024-03-17 DIAGNOSIS — F17200 Nicotine dependence, unspecified, uncomplicated: Secondary | ICD-10-CM

## 2024-03-17 DIAGNOSIS — I10 Essential (primary) hypertension: Secondary | ICD-10-CM

## 2024-03-17 DIAGNOSIS — Z0001 Encounter for general adult medical examination with abnormal findings: Secondary | ICD-10-CM | POA: Diagnosis not present

## 2024-03-17 DIAGNOSIS — T8332XD Displacement of intrauterine contraceptive device, subsequent encounter: Secondary | ICD-10-CM

## 2024-03-17 DIAGNOSIS — Z130 Encounter for screening for diseases of the blood and blood-forming organs and certain disorders involving the immune mechanism: Secondary | ICD-10-CM

## 2024-03-17 DIAGNOSIS — Z136 Encounter for screening for cardiovascular disorders: Secondary | ICD-10-CM

## 2024-03-17 DIAGNOSIS — Z1231 Encounter for screening mammogram for malignant neoplasm of breast: Secondary | ICD-10-CM

## 2024-03-17 DIAGNOSIS — Z1211 Encounter for screening for malignant neoplasm of colon: Secondary | ICD-10-CM

## 2024-03-17 DIAGNOSIS — Z6841 Body Mass Index (BMI) 40.0 and over, adult: Secondary | ICD-10-CM

## 2024-03-17 MED ORDER — BUDESONIDE-FORMOTEROL FUMARATE 160-4.5 MCG/ACT IN AERO: 2.0000 | INHALATION_SPRAY | Freq: Two times a day (BID) | RESPIRATORY_TRACT | 5 refills | Status: AC

## 2024-03-17 NOTE — Progress Notes (Signed)
 Complete physical exam   Patient: Amber Edwards   DOB: 1975/10/03   48 y.o. Female  MRN: [id number] Visit Date: 03/17/2024  Today's healthcare provider: LAURAINE LOISE BUOY, DO   Chief Complaint  Patient presents with   Annual Exam    Diet- Compound Semiglutide last shot in July Exercise- 3 times a week every other day Overall Feeling- A lot better since losing weight Sleep- A lot better Concerns- None, needs form for physical signed.  Declined vaccines.   Subjective    Amber Edwards is a 48 y.o. female who presents today for a complete physical exam.   HPI HPI     Annual Exam    Additional comments: Diet- Compound Semiglutide last shot in July Exercise- 3 times a week every other day Overall Feeling- A lot better since losing weight Sleep- A lot better Concerns- None, needs form for physical signed.  Declined vaccines.      Last edited by Terrel Powell LITTIE, CMA on 03/17/2024  8:57 AM.       Amber Edwards is a 48 year old female who presents for an annual physical exam.  She is currently taking Symbicort , and albuterol . Symbicort  is used primarily at night before bed and sometimes in the morning. She has not taken Protonix  since July. She has experienced weight loss of 40 pounds when using semaglutide  earlier in the year but has regained 6 pounds recently.  She has a history of smoking since age 61, currently smoking less than a pack per day, having reduced from a previous habit of one and a half to two packs per day. She has been at a half pack per day for several months and notes improved breathing with weight loss and reduced smoking.  She has been experiencing wheezing and a productive cough for the past three weeks, which is slowly improving. She uses her inhaler more frequently than usual. No chest pain or racing heartbeats. She reports improved breathing with weight loss and reduced smoking.  She describes a burning sensation in her right  lower abdomen that occurs sporadically and lasts a few seconds. This symptom had resolved but recently recurred. No nausea, vomiting, or changes in bowel movements.  She notes hair loss, which she associates with semaglutide  use. She also mentions a history of an IUD, which was found in her mid- pelvis on an x-ray, and she has concerns about its location.    Past Medical History:  Diagnosis Date   Acid reflux    Allergy    Anemia    Anxiety    Asthma    Back problem    Carpal tunnel syndrome    COPD (chronic obstructive pulmonary disease) (HCC)    Sciatica    Past Surgical History:  Procedure Laterality Date   left knee surgery     Social History   Socioeconomic History   Marital status: Married    Spouse name: Not on file   Number of children: 2   Years of education: Not on file   Highest education level: Some college, no degree  Occupational History   Not on file  Tobacco Use   Smoking status: Every Day    Current packs/day: 1.00    Average packs/day: 1 pack/day for 35.0 years (34.5 ttl pk-yrs)    Types: Cigarettes   Smokeless tobacco: Never   Tobacco comments:    Smoking since 48 yrs old  Vaping Use   Vaping status: Never Used  Substance and Sexual Activity   Alcohol use: No   Drug use: No   Sexual activity: Yes    Birth control/protection: I.U.D.    Comment: IUD is old and needs to be removed  Other Topics Concern   Not on file  Social History Narrative   Not on file   Social Drivers of Health   Financial Resource Strain: High Risk (03/12/2023)   Overall Financial Resource Strain (CARDIA)    Difficulty of Paying Living Expenses: Hard  Food Insecurity: Food Insecurity Present (03/12/2023)   Hunger Vital Sign    Worried About Running Out of Food in the Last Year: Sometimes true    Ran Out of Food in the Last Year: Sometimes true  Transportation Needs: No Transportation Needs (03/12/2023)   PRAPARE - Administrator, Civil Service (Medical):  No    Lack of Transportation (Non-Medical): No  Physical Activity: Insufficiently Active (03/12/2023)   Exercise Vital Sign    Days of Exercise per Week: 3 days    Minutes of Exercise per Session: 30 min  Stress: Stress Concern Present (03/12/2023)   [employer] of Occupational Health - Occupational Stress Questionnaire    Feeling of Stress : Very much  Social Connections: Unknown (03/12/2023)   Social Connection and Isolation Panel    Frequency of Communication with Friends and Family: More than three times a week    Frequency of Social Gatherings with Friends and Family: Twice a week    Attends Religious Services: Patient declined    Database Administrator or Organizations: No    Attends Engineer, Structural: Not on file    Marital Status: Married  Catering Manager Violence: Not on file   Family Status  Relation Name Status   Mother Romero Laurence Deceased   Father Iliani Vejar Sr (Not Specified)   Mat Aunt Dagoberto Avers (Not Specified)   MGM  (Not Specified)   PGF Helayne Edwards (Not Specified)   Cousin  (Not Specified)   Sister Tammy Hunley Alive  No partnership data on file   Family History  Problem Relation Age of Onset   Cancer Mother 21 - 53       Breast cancer developed into bone   ADD / ADHD Mother    Alcohol abuse Mother    Arthritis Mother    Depression Mother    COPD Father    Obesity Father    Heart failure Father 35 - 7   Arthritis Father    Asthma Father    Depression Father    Breast cancer Maternal Aunt    Memory loss Maternal Aunt    Cancer Maternal Aunt    Emphysema Maternal Grandmother    Diabetes Paternal Grandfather    Heart disease Paternal Grandfather    Obesity Paternal Grandfather    Lung cancer Cousin    Cancer Sister    No Known Allergies  Patient Care Team: Arna Luis, Lauraine SAILOR, DO as PCP - General (Family Medicine)   Medications: Outpatient Medications Prior to Visit  Medication Sig Note   albuterol  (VENTOLIN   HFA) 108 (90 Base) MCG/ACT inhaler Inhale 2 puffs into the lungs every 4 (four) hours as needed for wheezing or shortness of breath.    pantoprazole  (PROTONIX ) 40 MG tablet TAKE ONE TABLET BY MOUTH EVERY DAY. NEEDS APPOINTMENT FOR FURTHER REFILLS    [DISCONTINUED] budesonide -formoterol  (SYMBICORT ) 160-4.5 MCG/ACT inhaler Inhale 2 puffs into the lungs 2 (two) times daily.    [DISCONTINUED] Semaglutide -Weight  Management 0.25 MG/0.5ML SOAJ Inject 0.25 mg into the skin once a week for 28 days.    [DISCONTINUED] brompheniramine-pseudoephedrine-DM 30-2-10 MG/5ML syrup Take 5 mLs by mouth 4 (four) times daily as needed.    [DISCONTINUED] nicotine  polacrilex (COMMIT) 2 MG lozenge Take 1 lozenge (2 mg total) by mouth as needed for smoking cessation. 03/17/2024: Stated that they don't work.   [DISCONTINUED] predniSONE  (DELTASONE ) 20 MG tablet Take 2 tablets (40 mg total) by mouth daily with breakfast.    No facility-administered medications prior to visit.    Review of Systems  Constitutional:  Negative for chills, fatigue and fever.  HENT:  Negative for congestion, ear pain, rhinorrhea, sneezing and sore throat.   Eyes: Negative.  Negative for pain and redness.  Respiratory:  Positive for cough (productive), shortness of breath and wheezing.   Cardiovascular:  Negative for chest pain and leg swelling.  Gastrointestinal:  Positive for abdominal pain (mild intermittent; infrequent. RLQ). Negative for blood in stool, constipation, diarrhea and nausea.  Endocrine: Negative for polydipsia and polyphagia.  Genitourinary: Negative.  Negative for dysuria, flank pain, hematuria, pelvic pain, vaginal bleeding and vaginal discharge.  Musculoskeletal:  Negative for arthralgias, back pain, gait problem and joint swelling.  Skin:  Negative for rash.  Neurological: Negative.  Negative for dizziness, tremors, seizures, weakness, light-headedness, numbness and headaches.  Hematological:  Negative for adenopathy.   Psychiatric/Behavioral: Negative.  Negative for behavioral problems, confusion and dysphoric mood. The patient is not nervous/anxious and is not hyperactive.       Objective    BP (!) 166/105 (BP Location: Left Arm, Cuff Size: Normal)   Pulse 80   Temp 98.3 F (36.8 C) (Oral)   Ht 5' 2 (1.575 m)   Wt 241 lb 3.2 oz (109.4 kg)   SpO2 98%   BMI 44.12 kg/m    Physical Exam Vitals and nursing note reviewed.  Constitutional:      General: She is awake.     Appearance: Normal appearance.  HENT:     Head: Normocephalic and atraumatic.     Right Ear: Tympanic membrane, ear canal and external ear normal.     Left Ear: Tympanic membrane, ear canal and external ear normal.     Nose: Nose normal.     Mouth/Throat:     Mouth: Mucous membranes are moist.     Pharynx: Oropharynx is clear. No oropharyngeal exudate or posterior oropharyngeal erythema.  Eyes:     General: No scleral icterus.    Extraocular Movements: Extraocular movements intact.     Conjunctiva/sclera: Conjunctivae normal.     Pupils: Pupils are equal, round, and reactive to light.  Neck:     Thyroid: No thyromegaly or thyroid tenderness.  Cardiovascular:     Rate and Rhythm: Normal rate and regular rhythm.     Pulses: Normal pulses.     Heart sounds: Normal heart sounds.  Pulmonary:     Effort: Pulmonary effort is normal. No tachypnea, bradypnea or respiratory distress.     Breath sounds: Normal breath sounds. No stridor. No wheezing, rhonchi or rales.  Abdominal:     General: Bowel sounds are normal. There is no distension.     Palpations: Abdomen is soft. There is no mass.     Tenderness: There is no abdominal tenderness. There is no guarding.     Hernia: No hernia is present.  Musculoskeletal:     Cervical back: Normal range of motion and neck supple.     Right  lower leg: No edema.     Left lower leg: No edema.  Lymphadenopathy:     Cervical: No cervical adenopathy.  Skin:    General: Skin is warm and  dry.  Neurological:     Mental Status: She is alert and oriented to person, place, and time. Mental status is at baseline.  Psychiatric:        Mood and Affect: Mood normal.        Behavior: Behavior normal.      Last depression screening scores    03/17/2024    9:03 AM 03/14/2023    3:35 PM 10/18/2022   10:08 AM  PHQ 2/9 Scores  PHQ - 2 Score 0 3 3  PHQ- 9 Score 4 12 15    Last fall risk screening    03/17/2024    9:03 AM  Fall Risk   Falls in the past year? 0  Number falls in past yr: 0  Injury with Fall? 0  Risk for fall due to : No Fall Risks   Last Audit-C alcohol use screening    03/12/2023    4:46 PM  Alcohol Use Disorder Test (AUDIT)  1. How often do you have a drink containing alcohol? 0  2. How many drinks containing alcohol do you have on a typical day when you are drinking? 0  3. How often do you have six or more drinks on one occasion? 0  AUDIT-C Score 0   A score of 3 or more in women, and 4 or more in men indicates increased risk for alcohol abuse, EXCEPT if all of the points are from question 1   No results found for any visits on 03/17/24.  Assessment & Plan    Routine Health Maintenance and Physical Exam  Exercise Activities and Dietary recommendations  Goals   None     Immunization History  Administered Date(s) Administered   Td 08/17/1993    Health Maintenance  Topic Date Due   Mammogram  Never done   Fecal DNA (Cologuard)  Never done   Influenza Vaccine  08/17/2024 (Originally 12/19/2023)   COVID-19 Vaccine (1) 01/18/2025 (Originally 11/26/1979)   Pneumococcal Vaccine (1 of 2 - PCV) 03/17/2025 (Originally 11/25/1993)   Hepatitis B Vaccines 19-59 Average Risk (1 of 3 - 19+ 3-dose series) 03/17/2025 (Originally 11/25/1993)   Cervical Cancer Screening (HPV/Pap Cotest)  10/18/2027   HPV VACCINES  Aged Out   Meningococcal B Vaccine  Aged Out   DTaP/Tdap/Td  Discontinued   Colonoscopy  Discontinued   Hepatitis C Screening  Discontinued   HIV  Screening  Discontinued    Discussed health benefits of physical activity, and encouraged her to engage in regular exercise appropriate for her age and condition.   Annual physical exam  Mucopurulent chronic bronchitis (HCC) -     Budesonide -Formoterol  Fumarate; Inhale 2 puffs into the lungs 2 (two) times daily.  Dispense: 1 each; Refill: 5  High risk medication use -     Vitamin B12  Chronic GERD -     Vitamin B12  Encounter for screening for cardiovascular disorders -     Lipid panel  Screening for endocrine, metabolic and immunity disorder -     Comprehensive metabolic panel with GFR -     Hemoglobin A1c  Morbid obesity with BMI of 40.0-44.9, adult (HCC) -     Hemoglobin A1c  Encounter for screening mammogram for breast cancer -     3D Screening Mammogram, Left and Right; Future  Encounter for colorectal cancer screening -     Cologuard  Nicotine  dependence with current use  Encounter for smoking cessation counseling  Essential hypertension -     Microalbumin / creatinine urine ratio -     Ambulatory referral to Cardiology  Intrauterine contraceptive device threads lost, subsequent encounter -     Ambulatory referral to Gynecology      Annual physical exam Physical exam overall unremarkable except as noted above. Routine lab work ordered as noted.  Discussed vaccinations. Declined COVID vaccine and pneumonia vaccine. Believes hepatitis B series is complete. - Order mammogram. - Order Cologuard. - Patient declined vaccinations.  Essential hypertension Elevated blood pressure noted.  Discussed importance of blood pressure control to prevent cardiovascular complications, including heart attack and stroke.  Patient requesting referral to cardiology for further evaluation/discussion prior to considering medication - Patient declined to start medication. - Referral sent to cardiology at patient request for further evaluation and discussion.  Mucopurulent  chronic bronchitis with recent exacerbation Inconsistent use of maintenance inhaler.  Recent exacerbation with increased wheezing and productive cough, now improving with more consistent inhaler use. - Refill Symbicort  and albuterol  inhaler prescriptions.  Nicotine  dependence with current use; encounter for smoking cessation counseling Long-term use, reduced to less than a pack per day. Reports headaches and improved breathing with reduced smoking. No interest in cessation aids at this time (offered and declined).  Morbid obesity (BMI 40.0-44.9) Managed previous with semaglutide , 40-pound weight loss, 6-pound regain. Discussed monitoring food intake and challenges with semaglutide  tapering.  Continue to focus on lifestyle modifications.  Patient considering restarting semaglutide  through compounding pharmacy.  Gastroesophageal reflux disease (GERD) Will check vitamin B12 level today due to reduced absorption with chronic PPI use  Intrauterine contraceptive device threads lost Unable to remove IUD at previous visit.  Patient was to follow-up with gynecology but has not done so.  Based on recent x-ray, IUD likely in correct position, no surgical intervention needed. - Refer to OB-GYN for evaluation of IUD.    Return in about 6 months (around 09/15/2024) for Chronic f/u.     I discussed the assessment and treatment plan with the patient  The patient was provided an opportunity to ask questions and all were answered. The patient agreed with the plan and demonstrated an understanding of the instructions.   The patient was advised to call back or seek an in-person evaluation if the symptoms worsen or if the condition fails to improve as anticipated.    LAURAINE LOISE BUOY, DO  [facility] [phone] (phone) [phone] (fax)  [facility]

## 2024-03-18 LAB — MICROALBUMIN / CREATININE URINE RATIO
Creatinine, Urine: 53.3 mg/dL
Microalb/Creat Ratio: 140 mg/g{creat} — ABNORMAL HIGH (ref 0–29)
Microalbumin, Urine: 74.7 ug/mL

## 2024-03-18 LAB — COMPREHENSIVE METABOLIC PANEL WITH GFR
ALT: 24 IU/L (ref 0–32)
AST: 16 IU/L (ref 0–40)
Albumin: 4.2 g/dL (ref 3.9–4.9)
Alkaline Phosphatase: 106 IU/L (ref 41–116)
BUN/Creatinine Ratio: 24 — ABNORMAL HIGH (ref 9–23)
BUN: 15 mg/dL (ref 6–24)
Bilirubin Total: 0.4 mg/dL (ref 0.0–1.2)
CO2: 21 mmol/L (ref 20–29)
Calcium: 9.5 mg/dL (ref 8.7–10.2)
Chloride: 102 mmol/L (ref 96–106)
Creatinine, Ser: 0.63 mg/dL (ref 0.57–1.00)
Globulin, Total: 2.9 g/dL (ref 1.5–4.5)
Glucose: 97 mg/dL (ref 70–99)
Potassium: 4.5 mmol/L (ref 3.5–5.2)
Sodium: 139 mmol/L (ref 134–144)
Total Protein: 7.1 g/dL (ref 6.0–8.5)
eGFR: 109 mL/min/1.73 (ref >59)

## 2024-03-18 LAB — LIPID PANEL
Chol/HDL Ratio: 4 ratio (ref 0.0–4.4)
Cholesterol, Total: 172 mg/dL (ref 100–199)
HDL: 43 mg/dL (ref >39)
LDL Chol Calc (NIH): 106 mg/dL — ABNORMAL HIGH (ref 0–99)
Triglycerides: 130 mg/dL (ref 0–149)
VLDL Cholesterol Cal: 23 mg/dL (ref 5–40)

## 2024-03-18 LAB — VITAMIN B12: Vitamin B-12: 439 pg/mL (ref 232–1245)

## 2024-03-18 LAB — HEMOGLOBIN A1C
Est. average glucose Bld gHb Est-mCnc: 120 mg/dL
Hgb A1c MFr Bld: 5.8 % — ABNORMAL HIGH (ref 4.8–5.6)

## 2024-03-20 ENCOUNTER — Other Ambulatory Visit: Payer: Self-pay | Admitting: Family Medicine

## 2024-03-20 DIAGNOSIS — J411 Mucopurulent chronic bronchitis: Secondary | ICD-10-CM

## 2024-03-27 ENCOUNTER — Other Ambulatory Visit: Payer: Self-pay | Admitting: Family Medicine

## 2024-03-27 DIAGNOSIS — K219 Gastro-esophageal reflux disease without esophagitis: Secondary | ICD-10-CM

## 2024-03-31 ENCOUNTER — Ambulatory Visit: Payer: Self-pay | Admitting: Family Medicine

## 2024-04-01 NOTE — Progress Notes (Unsigned)
  Cardiology Office Note   Date:  04/02/2024  ID:  DEVORAH GIVHAN, DOB September 03, 1975, MRN [id number] PCP: Donzella Lauraine SAILOR, DO  [facility] Providers Cardiologist:  Caron Poser, MD     History of Present Illness Amber Edwards is a 48 y.o. female PMH morbid obesity who presents for further evaluation management of hypertension.  Patient recently seen by PCP for this issue 03/17/2024.  Patient was hesitant to start any antihypertensive therapies.  Wanted to discuss with cardiology first.  Last LDL 106 02/2024.  Patient notes she has been under a lot of stress lately.  She also reports she has been taking compounded semaglutide  and has already lost 40 pounds, which is excellent.  She is a current smoker.  She would like to avoid starting medications unless absolutely necessary.  Relevant CVD History -None   ROS: Pt denies any chest discomfort, jaw pain, arm pain, palpitations, syncope, presyncope, orthopnea, PND, or LE edema.  Studies Reviewed I have independently reviewed the patient's ECG, recent medical records, recent blood work.  Physical Exam VS:  BP 138/84 (BP Location: Left Arm, Patient Position: Sitting, Cuff Size: Large)   Pulse 92   Ht 5' 4 (1.626 m)   Wt 234 lb (106.1 kg)   SpO2 98%   BMI 40.17 kg/m        Wt Readings from Last 3 Encounters:  04/02/24 234 lb (106.1 kg)  03/17/24 241 lb 3.2 oz (109.4 kg)  03/14/23 274 lb 9.6 oz (124.6 kg)    GEN: No acute distress. NECK: No JVD; No carotid bruits. CARDIAC: RRR, no murmurs, rubs, gallops. RESPIRATORY:  Clear to auscultation. EXTREMITIES:  Warm and well-perfused. No edema.  ASSESSMENT AND PLAN HTN HLD Metabolic syndrome Morbid obesity Tobacco use Patient presents for further evaluation of asymptomatic hypertension and hyperlipidemia.  Suspect that this is mostly being driven by smoking, and obesity/metabolic syndrome.  She has already lost approximately 40 pounds on semaglutide , which is  great.  She would like to avoid medications at all costs if possible.  Her blood pressure is mildly elevated in clinic today.  Last LDL was 106 02/2024.  Plan: - Given her hesitancy to start blood pressure medications, I recommended that she obtain a home blood pressure cuff and take accurate readings.  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 -Continue weight loss measures with semaglutide .  I am hopeful that if she continues to lose a significant amount of weight, then her blood pressure will go down accordingly. - Since she is currently smoking, I recommended we start Crestor 5 mg daily.  I also recommended that she stop smoking if she is able.  She would like to continue with weight loss efforts for the time being. - After a long shared decision making discussion, the patient elected to continue weight loss efforts with semaglutide  and do home blood pressure monitoring for now without starting any antihypertensives or lipid-lowering therapies.  I discussed that we can have her recheck her lipids in a few months and then we can rediscuss this at a subsequent visit.        Dispo: RTC 6 months or sooner as needed  Signed, Caron Poser, MD

## 2024-04-02 ENCOUNTER — Ambulatory Visit

## 2024-04-02 VITALS — BP 138/84 | HR 92 | Ht 64.0 in | Wt 234.0 lb

## 2024-04-02 DIAGNOSIS — Z79899 Other long term (current) drug therapy: Secondary | ICD-10-CM | POA: Diagnosis not present

## 2024-04-02 DIAGNOSIS — Z720 Tobacco use: Secondary | ICD-10-CM

## 2024-04-02 DIAGNOSIS — I10 Essential (primary) hypertension: Secondary | ICD-10-CM | POA: Diagnosis not present

## 2024-04-02 DIAGNOSIS — Z7189 Other specified counseling: Secondary | ICD-10-CM

## 2024-04-02 NOTE — Patient Instructions (Signed)
 Medication Instructions:  Your physician recommends that you continue on your current medications as directed. Please refer to the Current Medication list given to you today.  *If you need a refill on your cardiac medications before your next appointment, please call your pharmacy*  Lab Work: Your provider would like for you to return in 3 months to have the following labs drawn: Lipid Panel.   Please go to [facility] [address] ([facility] [address], [place] [address] You do not need an appointment.  They are open from 8 am- 4:30 pm.  Lunch from 1:00 pm- 2:00 pm You WILL need to be fasting.  If you have labs (blood work) drawn today and your tests are completely normal, you will receive your results only by: MyChart Message (if you have MyChart) OR A paper copy in the mail If you have any lab test that is abnormal or we need to change your treatment, we will call you to review the results.  Testing/Procedures: No test ordered today   Follow-Up: At [facility], you and your health needs are our priority.  As part of our continuing mission to provide you with exceptional heart care, our providers are all part of one team.  This team includes your primary Cardiologist (physician) and Advanced Practice Providers or APPs (Physician Assistants and Nurse Practitioners) who all work together to provide you with the care you need, when you need it.  Your next appointment:   6 month(s)  Provider:   Caron Poser, MD    We recommend signing up for the patient portal called MyChart.  Sign up information is provided on this After Visit Summary.  MyChart is used to connect with patients for Virtual Visits (Telemedicine).  Patients are able to view lab/test results, encounter notes, upcoming appointments, etc.  Non-urgent messages can be sent to your provider as well.   To learn more about what you can do with MyChart, go to [email or web address].

## 2024-05-31 ENCOUNTER — Encounter: Admitting: Obstetrics & Gynecology

## 2024-07-06 ENCOUNTER — Encounter: Admitting: Obstetrics & Gynecology

## 2024-09-15 ENCOUNTER — Ambulatory Visit: Admitting: Family Medicine
